# Patient Record
Sex: Male | Born: 1990 | Race: White | Hispanic: No | Marital: Married | Smoking: Never smoker
Health system: Southern US, Community
[De-identification: ages and names within clinical notes are randomized; demographics above are authoritative.]

## PROBLEM LIST (undated history)

## (undated) DIAGNOSIS — K219 Gastro-esophageal reflux disease without esophagitis: Secondary | ICD-10-CM

## (undated) HISTORY — DX: Gastro-esophageal reflux disease without esophagitis: K21.9

## (undated) HISTORY — PX: WISDOM TOOTH EXTRACTION: SHX21

---

## 2012-05-23 LAB — TSH: TSH: 1.4 (ref <=5.90)

## 2014-12-11 ENCOUNTER — Encounter: Payer: Self-pay | Admitting: Emergency Medicine

## 2014-12-11 ENCOUNTER — Emergency Department
Admission: EM | Admit: 2014-12-11 | Discharge: 2014-12-11 | Disposition: A | Discharge status: Home / Self Care | Payer: BC Managed Care – PPO | Attending: Emergency Medicine | Admitting: Emergency Medicine

## 2014-12-11 DIAGNOSIS — R21 Rash and other nonspecific skin eruption: Secondary | ICD-10-CM

## 2014-12-11 DIAGNOSIS — W57XXXA Bitten or stung by nonvenomous insect and other nonvenomous arthropods, initial encounter: Secondary | ICD-10-CM

## 2014-12-11 DIAGNOSIS — T148 Other injury of unspecified body region: Secondary | ICD-10-CM | POA: Insufficient documentation

## 2014-12-11 DIAGNOSIS — W57XXXD Bitten or stung by nonvenomous insect and other nonvenomous arthropods, subsequent encounter: Secondary | ICD-10-CM | POA: Diagnosis not present

## 2014-12-11 LAB — HEPATIC FUNCTION PANEL
ALT: 125 U/L — ABNORMAL HIGH (ref 17–63)
AST: 120 U/L — AB (ref 15–41)
Albumin: 4.7 g/dL (ref 3.5–5.0)
Alkaline Phosphatase: 60 U/L (ref 38–126)
BILIRUBIN TOTAL: 0.5 mg/dL (ref 0.3–1.2)
Total Protein: 7 g/dL (ref 6.5–8.1)

## 2014-12-11 LAB — CBC WITH DIFFERENTIAL/PLATELET
Basophils Absolute: 0.1 10*3/uL (ref 0–0.1)
Basophils Relative: 1 %
EOS PCT: 2 %
Eosinophils Absolute: 0.2 10*3/uL (ref 0–0.7)
HEMATOCRIT: 46 % (ref 40.0–52.0)
Hemoglobin: 16.2 g/dL (ref 13.0–18.0)
LYMPHS PCT: 23 %
Lymphs Abs: 2.2 10*3/uL (ref 1.0–3.6)
MCH: 29.3 pg (ref 26.0–34.0)
MCHC: 35.1 g/dL (ref 32.0–36.0)
MCV: 83.4 fL (ref 80.0–100.0)
MONO ABS: 1 10*3/uL (ref 0.2–1.0)
MONOS PCT: 10 %
NEUTROS ABS: 6.3 10*3/uL (ref 1.4–6.5)
Neutrophils Relative %: 64 %
PLATELETS: 196 10*3/uL (ref 150–440)
RBC: 5.52 MIL/uL (ref 4.40–5.90)
RDW: 12.7 % (ref 11.5–14.5)
WBC: 9.7 10*3/uL (ref 3.8–10.6)

## 2014-12-11 LAB — BASIC METABOLIC PANEL
Anion gap: 9 (ref 5–15)
BUN: 18 mg/dL (ref 6–20)
CALCIUM: 9.3 mg/dL (ref 8.9–10.3)
CO2: 23 mmol/L (ref 22–32)
CREATININE: 1.08 mg/dL (ref 0.61–1.24)
Chloride: 108 mmol/L (ref 101–111)
GFR calc Af Amer: >60 mL/min (ref >60)
GLUCOSE: 93 mg/dL (ref 65–99)
Potassium: 3.7 mmol/L (ref 3.5–5.1)
Sodium: 140 mmol/L (ref 135–145)

## 2014-12-11 MED ORDER — DOXYCYCLINE HYCLATE 100 MG PO TABS: 100.0000 mg | ORAL_TABLET | Freq: Two times a day (BID) | ORAL | Status: DC

## 2014-12-11 MED ORDER — DOXYCYCLINE HYCLATE 100 MG PO TABS
100.0000 mg | ORAL_TABLET | Freq: Once | ORAL | Status: AC
  Administered 2014-12-11: 100 mg via ORAL
  Filled 2014-12-11: qty 1

## 2014-12-11 NOTE — ED Notes (Signed)
Awaiting LFT results before pt is dc'd.  Pt aware.

## 2014-12-11 NOTE — ED Notes (Signed)
Was treated for possible strep skin infection R ankle aug 12th, finished antibiotic yesterday, also had several tick bites recently however none of them were engorged. Nauseated today.

## 2014-12-11 NOTE — ED Provider Notes (Signed)
CSN: [id number]     Arrival date & time 12/11/14  1839 History   First MD Initiated Contact with Patient 12/11/14 1936     Chief Complaint  Patient presents with  . Rash    fine rash all over began yesterday, denies itching     (Consider location/radiation/quality/duration/timing/severity/associated sxs/prior Treatment) HPI 24 year old male presents to the emergency department for evaluation of skin rash. Patient states he was recently treated for skin infection around that area of the tick bite along his right lateral ankle on 12/03/2014. He has finished the antibiotic's. He continues to have red macular rash over the right lateral ankle. Over the last 24 hours he is developed rash along the ankles and feet and hands and wrist and now spread more proximally up the arms and thighs. He has some rash on his abdomen and back. Overall patient feels well. He denies any pain or body aches. He does have mild nausea. He does present with a low-grade fever 99.3. Patient does note several tick bites on his left foot that occurred over the last few weeks.   History reviewed. No pertinent past medical history. History reviewed. No pertinent past surgical history. No family history on file. Social History  Substance Use Topics  . Smoking status: Never Smoker   . Smokeless tobacco: None  . Alcohol Use: Yes    Review of Systems  Constitutional: Negative.  Negative for fever, chills, activity change and appetite change.  HENT: Negative for congestion, ear pain, mouth sores, rhinorrhea, sinus pressure, sore throat and trouble swallowing.   Eyes: Negative for photophobia, pain and discharge.  Respiratory: Negative for cough, chest tightness and shortness of breath.   Cardiovascular: Negative for chest pain and leg swelling.  Gastrointestinal: Positive for nausea. Negative for vomiting, abdominal pain, diarrhea and abdominal distention.  Genitourinary: Negative for dysuria and difficulty urinating.   Musculoskeletal: Negative for back pain, arthralgias and gait problem.  Skin: Positive for rash. Negative for color change.  Neurological: Negative for dizziness and headaches.  Hematological: Negative for adenopathy.  Psychiatric/Behavioral: Negative for behavioral problems and agitation.      Allergies  Review of patient's allergies indicates no known allergies.  Home Medications   Prior to Admission medications   Medication Sig Start Date End Date Taking? Authorizing Provider  doxycycline (VIBRA-TABS) 100 MG tablet Take 1 tablet (100 mg total) by mouth 2 (two) times daily. 12/11/14   Duanne Guess, PA-C   BP 134/64 mmHg  Pulse 82  Temp(Src) 99.3 F (37.4 C) (Oral)  Resp 18  Ht 6\' 1"  (1.854 m)  Wt 202 lb (91.627 kg)  BMI 26.66 kg/m2  SpO2 96% Physical Exam  Constitutional: He is oriented to person, place, and time. He appears well-developed and well-nourished.  HENT:  Head: Normocephalic and atraumatic.  Eyes: Conjunctivae and EOM are normal. Pupils are equal, round, and reactive to light.  Neck: Normal range of motion. Neck supple.  Cardiovascular: Normal rate, regular rhythm, normal heart sounds and intact distal pulses.   Pulmonary/Chest: Effort normal and breath sounds normal. No respiratory distress. He has no wheezes. He has no rales. He exhibits no tenderness.  Abdominal: Soft. Bowel sounds are normal. He exhibits no distension. There is no tenderness.  Musculoskeletal: Normal range of motion. He exhibits no edema or tenderness.  Neurological: He is alert and oriented to person, place, and time.  Skin: Skin is warm and dry.  Patient with diffuse erythematous macular rash that is most prominent along the ankles  and wrist and seems to taper proximally. Rash is present on the lower legs as well as the arms. There is rash present on the back and abdomen. Erythematous macular rash will blanch. There is small areas of petechiae mixed in with erythematous macular rash.   Psychiatric: He has a normal mood and affect. His behavior is normal. Judgment and thought content normal.    ED Course  Procedures (including critical care time) Labs Review Labs Reviewed  CBC WITH DIFFERENTIAL/PLATELET  BASIC METABOLIC PANEL  HEPATIC FUNCTION PANEL    Imaging Review No results found. I have personally reviewed and evaluated these images and lab results as part of my medical decision-making.   EKG Interpretation None      MDM   Final diagnoses:  Tick bite  Skin rash    24 year old male with history of tick bites and rash for the last 24 hours. Rash began along the wrist and ankles and has been spreading centrally. He has a low-grade fever. Overall patient appears well. Labs are normal patient tolerating by mouth well. Patient was started with doxycycline 100 mg twice a day for 14 days. He was educated on red flags to return to ER for.    Duanne Guess, PA-C 12/11/14 2153  Hinda Kehr, MD 12/12/14 0005

## 2014-12-11 NOTE — Discharge Instructions (Signed)
Rash A rash is a change in the color or feel of your skin. There are many different types of rashes. You may have other problems along with your rash. HOME CARE  Avoid the thing that caused your rash.  Do not scratch your rash.  You may take cools baths to help stop itching.  Only take medicines as told by your doctor.  Keep all doctor visits as told. GET HELP RIGHT AWAY IF:   Your pain, puffiness (swelling), or redness gets worse.  You have a fever.  You have new or severe problems.  You have body aches, watery poop (diarrhea), or you throw up (vomit).  Your rash is not better after 3 days. MAKE SURE YOU:  1. Understand these instructions. 2. Will watch your condition. 3. Will get help right away if you are not doing well or get worse. Document Released: 09/26/2007 Document Revised: 07/02/2011 Document Reviewed: 01/22/2011 [facility] Patient Information 2015 [place], [place]. This information is not intended to replace advice given to you by your health care provider. Make sure you discuss any questions you have with your health care provider.  Tick Bite Information Ticks are insects that attach themselves to the skin and draw blood for food. There are various types of ticks. Common types include wood ticks and deer ticks. Most ticks live in shrubs and grassy areas. Ticks can climb onto your body when you make contact with leaves or grass where the tick is waiting. The most common places on the body for ticks to attach themselves are the scalp, neck, armpits, waist, and groin. Most tick bites are harmless, but sometimes ticks carry germs that cause diseases. These germs can be spread to a person during the tick's feeding process. The chance of a disease spreading through a tick bite depends on:   The type of tick.  Time of year.   How long the tick is attached.   Geographic location.  HOW CAN YOU PREVENT TICK BITES? Take these steps to help prevent tick bites when you are  outdoors:  Wear protective clothing. Long sleeves and long pants are best.   Wear white clothes so you can see ticks more easily.  Tuck your pant legs into your socks.   If walking on a trail, stay in the middle of the trail to avoid brushing against bushes.  Avoid walking through areas with long grass.  Put insect repellent on all exposed skin and along boot tops, pant legs, and sleeve cuffs.   Check clothing, hair, and skin repeatedly and before going inside.   Brush off any ticks that are not attached.  Take a shower or bath as soon as possible after being outdoors.  WHAT IS THE PROPER WAY TO REMOVE A TICK? Ticks should be removed as soon as possible to help prevent diseases caused by tick bites. 4. If latex gloves are available, put them on before trying to remove a tick.  5. Using fine-point tweezers, grasp the tick as close to the skin as possible. You may also use curved forceps or a tick removal tool. Grasp the tick as close to its head as possible. Avoid grasping the tick on its body. 6. Pull gently with steady upward pressure until the tick lets go. Do not twist the tick or jerk it suddenly. This may break off the tick's head or mouth parts. 7. Do not squeeze or crush the tick's body. This could force disease-carrying fluids from the tick into your body.  8. After  the tick is removed, wash the bite area and your hands with soap and water or other disinfectant such as alcohol. 9. Apply a small amount of antiseptic cream or ointment to the bite site.  10. Wash and disinfect any instruments that were used.  Do not try to remove a tick by applying a hot match, petroleum jelly, or fingernail polish to the tick. These methods do not work and may increase the chances of disease being spread from the tick bite.  WHEN SHOULD YOU SEEK MEDICAL CARE? Contact your health care provider if you are unable to remove a tick from your skin or if a part of the tick breaks off and is  stuck in the skin.  After a tick bite, you need to be aware of signs and symptoms that could be related to diseases spread by ticks. Contact your health care provider if you develop any of the following in the days or weeks after the tick bite:  Unexplained fever.  Rash. A circular rash that appears days or weeks after the tick bite may indicate the possibility of Lyme disease. The rash may resemble a target with a bull's-eye and may occur at a different part of your body than the tick bite.  Redness and swelling in the area of the tick bite.   Tender, swollen lymph glands.   Diarrhea.   Weight loss.   Cough.   Fatigue.   Muscle, joint, or bone pain.   Abdominal pain.   Headache.   Lethargy or a change in your level of consciousness.  Difficulty walking or moving your legs.   Numbness in the legs.   Paralysis.  Shortness of breath.   Confusion.   Repeated vomiting.  Document Released: 04/06/2000 Document Revised: 01/28/2013 Document Reviewed: 09/17/2012 [facility] Patient Information 2015 [place], [place]. This information is not intended to replace advice given to you by your health care provider. Make sure you discuss any questions you have with your health care provider.

## 2014-12-11 NOTE — ED Notes (Signed)
States sore on leg had been due to tick bite.

## 2016-06-04 DIAGNOSIS — Z23 Encounter for immunization: Secondary | ICD-10-CM | POA: Diagnosis not present

## 2016-08-13 DIAGNOSIS — N398 Other specified disorders of urinary system: Secondary | ICD-10-CM | POA: Diagnosis not present

## 2016-09-03 DIAGNOSIS — R300 Dysuria: Secondary | ICD-10-CM | POA: Diagnosis not present

## 2016-09-03 LAB — BASIC METABOLIC PANEL
BUN: 17 (ref 4–21)
Creatinine: 1 (ref 0.6–1.3)
Glucose: 87
POTASSIUM: 4.2 (ref 3.4–5.3)
Sodium: 141 (ref 137–147)

## 2016-09-03 LAB — CBC AND DIFFERENTIAL
HCT: 49 (ref 41–53)
HEMOGLOBIN: 16.4 (ref 13.5–17.5)
Platelets: 196 (ref 150–399)
WBC: 5.6

## 2016-09-11 DIAGNOSIS — R350 Frequency of micturition: Secondary | ICD-10-CM | POA: Diagnosis not present

## 2016-09-11 DIAGNOSIS — R102 Pelvic and perineal pain: Secondary | ICD-10-CM | POA: Diagnosis not present

## 2016-09-19 DIAGNOSIS — R102 Pelvic and perineal pain: Secondary | ICD-10-CM | POA: Diagnosis not present

## 2016-09-19 DIAGNOSIS — N3941 Urge incontinence: Secondary | ICD-10-CM | POA: Diagnosis not present

## 2016-11-22 ENCOUNTER — Encounter: Payer: Self-pay | Admitting: Family Medicine

## 2016-12-10 DIAGNOSIS — Z113 Encounter for screening for infections with a predominantly sexual mode of transmission: Secondary | ICD-10-CM | POA: Diagnosis not present

## 2016-12-10 DIAGNOSIS — Z23 Encounter for immunization: Secondary | ICD-10-CM | POA: Diagnosis not present

## 2016-12-11 LAB — HIV ANTIBODY (ROUTINE TESTING W REFLEX): HIV: NONREACTIVE

## 2016-12-26 DIAGNOSIS — F4320 Adjustment disorder, unspecified: Secondary | ICD-10-CM | POA: Diagnosis not present

## 2016-12-26 DIAGNOSIS — F515 Nightmare disorder: Secondary | ICD-10-CM | POA: Diagnosis not present

## 2017-01-01 DIAGNOSIS — F4320 Adjustment disorder, unspecified: Secondary | ICD-10-CM | POA: Diagnosis not present

## 2017-01-01 DIAGNOSIS — F515 Nightmare disorder: Secondary | ICD-10-CM | POA: Diagnosis not present

## 2017-01-08 DIAGNOSIS — F4320 Adjustment disorder, unspecified: Secondary | ICD-10-CM | POA: Diagnosis not present

## 2017-01-08 DIAGNOSIS — F515 Nightmare disorder: Secondary | ICD-10-CM | POA: Diagnosis not present

## 2017-01-16 DIAGNOSIS — R102 Pelvic and perineal pain: Secondary | ICD-10-CM | POA: Diagnosis not present

## 2017-01-16 DIAGNOSIS — F4320 Adjustment disorder, unspecified: Secondary | ICD-10-CM | POA: Diagnosis not present

## 2017-01-16 DIAGNOSIS — R350 Frequency of micturition: Secondary | ICD-10-CM | POA: Diagnosis not present

## 2017-01-23 DIAGNOSIS — F4320 Adjustment disorder, unspecified: Secondary | ICD-10-CM | POA: Diagnosis not present

## 2017-02-13 DIAGNOSIS — F4320 Adjustment disorder, unspecified: Secondary | ICD-10-CM | POA: Diagnosis not present

## 2017-02-20 DIAGNOSIS — Z23 Encounter for immunization: Secondary | ICD-10-CM | POA: Diagnosis not present

## 2017-06-11 DIAGNOSIS — Z23 Encounter for immunization: Secondary | ICD-10-CM | POA: Diagnosis not present

## 2017-06-13 DIAGNOSIS — M62830 Muscle spasm of back: Secondary | ICD-10-CM | POA: Diagnosis not present

## 2017-10-28 DIAGNOSIS — Z23 Encounter for immunization: Secondary | ICD-10-CM | POA: Diagnosis not present

## 2017-12-25 ENCOUNTER — Encounter: Payer: Self-pay | Admitting: Family Medicine

## 2017-12-25 ENCOUNTER — Ambulatory Visit (INDEPENDENT_AMBULATORY_CARE_PROVIDER_SITE_OTHER): Payer: BLUE CROSS/BLUE SHIELD | Admitting: Family Medicine

## 2017-12-25 ENCOUNTER — Ambulatory Visit: Payer: Self-pay | Admitting: Family Medicine

## 2017-12-25 VITALS — BP 110/72 | HR 63 | Temp 98.7°F | Ht 73.0 in | Wt 191.6 lb

## 2017-12-25 DIAGNOSIS — Z862 Personal history of diseases of the blood and blood-forming organs and certain disorders involving the immune mechanism: Secondary | ICD-10-CM | POA: Diagnosis not present

## 2017-12-25 DIAGNOSIS — Z83438 Family history of other disorder of lipoprotein metabolism and other lipidemia: Secondary | ICD-10-CM | POA: Diagnosis not present

## 2017-12-25 DIAGNOSIS — Z23 Encounter for immunization: Secondary | ICD-10-CM

## 2017-12-25 DIAGNOSIS — Z0000 Encounter for general adult medical examination without abnormal findings: Secondary | ICD-10-CM | POA: Diagnosis not present

## 2017-12-25 NOTE — Patient Instructions (Signed)
Preventive Care 18-39 Years, Male Preventive care refers to lifestyle choices and visits with your health care provider that can promote health and wellness. What does preventive care include?  A yearly physical exam. This is also called an annual well check.  Dental exams once or twice a year.  Routine eye exams. Ask your health care provider how often you should have your eyes checked.  Personal lifestyle choices, including: ? Daily care of your teeth and gums. ? Regular physical activity. ? Eating a healthy diet. ? Avoiding tobacco and drug use. ? Limiting alcohol use. ? Practicing safe sex. What happens during an annual well check? The services and screenings done by your health care provider during your annual well check will depend on your age, overall health, lifestyle risk factors, and family history of disease. Counseling Your health care provider may ask you questions about your:  Alcohol use.  Tobacco use.  Drug use.  Emotional well-being.  Home and relationship well-being.  Sexual activity.  Eating habits.  Work and work Statistician.  Screening You may have the following tests or measurements:  Height, weight, and BMI.  Blood pressure.  Lipid and cholesterol levels. These may be checked every 5 years starting at age 34.  Diabetes screening. This is done by checking your blood sugar (glucose) after you have not eaten for a while (fasting).  Skin check.  Hepatitis C blood test.  Hepatitis B blood test.  Sexually transmitted disease (STD) testing.  Discuss your test results, treatment options, and if necessary, the need for more tests with your health care provider. Vaccines Your health care provider may recommend certain vaccines, such as:  Influenza vaccine. This is recommended every year.  Tetanus, diphtheria, and acellular pertussis (Tdap, Td) vaccine. You may need a Td booster every 10 years.  Varicella vaccine. You may need this if you  have not been vaccinated.  HPV vaccine. If you are 23 or younger, you may need three doses over 6 months.  Measles, mumps, and rubella (MMR) vaccine. You may need at least one dose of MMR.You may also need a second dose.  Pneumococcal 13-valent conjugate (PCV13) vaccine. You may need this if you have certain conditions and have not been vaccinated.  Pneumococcal polysaccharide (PPSV23) vaccine. You may need one or two doses if you smoke cigarettes or if you have certain conditions.  Meningococcal vaccine. One dose is recommended if you are age 65-21 years and a first-year college student living in a residence hall, or if you have one of several medical conditions. You may also need additional booster doses.  Hepatitis A vaccine. You may need this if you have certain conditions or if you travel or work in places where you may be exposed to hepatitis A.  Hepatitis B vaccine. You may need this if you have certain conditions or if you travel or work in places where you may be exposed to hepatitis B.  Haemophilus influenzae type b (Hib) vaccine. You may need this if you have certain risk factors.  Talk to your health care provider about which screenings and vaccines you need and how often you need them. This information is not intended to replace advice given to you by your health care provider. Make sure you discuss any questions you have with your health care provider. Document Released: 06/05/2001 Document Revised: 12/28/2015 Document Reviewed: 02/08/2015 Elsevier Interactive Patient Education  [employer].

## 2017-12-25 NOTE — Progress Notes (Addendum)
Disregard. See other progress note

## 2017-12-25 NOTE — Progress Notes (Deleted)
Patient: Brandon Mcbride, Male    DOB: 06/01/1990, 27 y.o.   MRN: [id number] Visit Date: 12/25/2017  Today's Provider: [name], MD   No chief complaint on file.  Subjective:   New Patient: Brandon Mcbride is a 27 year old male who presents today to [facility] as a new patient.  -----------------------------------------------------------------   Review of Systems  Constitutional: Negative.   HENT: Negative.   Eyes: Negative.   Respiratory: Negative.   Cardiovascular: Negative.   Gastrointestinal: Negative.   Endocrine: Negative.   Genitourinary: Negative.   Musculoskeletal: Negative.   Skin: Negative.   Allergic/Immunologic: Negative.   Neurological: Negative.   Hematological: Negative.   Psychiatric/Behavioral: Negative.     Social History      He         Social History   Socioeconomic History  . Marital status: Not on file    Spouse name: Not on file  . Number of children: Not on file  . Years of education: Not on file  . Highest education level: Not on file  Occupational History  . Not on file  Social Needs  . Financial resource strain: Not on file  . Food insecurity:    Worry: Not on file    Inability: Not on file  . Transportation needs:    Medical: Not on file    Non-medical: Not on file  Tobacco Use  . Smoking status: Not on file  Substance and Sexual Activity  . Alcohol use: Not on file  . Drug use: Not on file  . Sexual activity: Not on file  Lifestyle  . Physical activity:    Days per week: Not on file    Minutes per session: Not on file  . Stress: Not on file  Relationships  . Social connections:    Talks on phone: Not on file    Gets together: Not on file    Attends religious service: Not on file    Active member of club or organization: Not on file    Attends meetings of clubs or organizations: Not on file    Relationship status: Not on file  Other Topics Concern  . Not on file  Social History Narrative    . Not on file    No past medical history on file.   There are no active problems to display for this patient.   *** The histories are not reviewed yet. Please review them in the "History" navigator section and refresh this Guy.  Family History        No family status information on file.        His family history is not on file.      Allergies not on file  No current outpatient medications on file.   No care team member to display      Objective:   Vitals: There were no vitals taken for this visit.  There were no vitals filed for this visit.   Physical Exam   Depression Screen No flowsheet data found.    Assessment & Plan:     Routine Health Maintenance and Physical Exam  Exercise Activities and Dietary recommendations Goals   None      There is no immunization history on file for this patient.  There are no preventive care reminders to display for this patient.   Discussed health benefits of physical activity, and encouraged him to engage in regular exercise appropriate for his age and  condition.    --------------------------------------------------------------------    [name], MD  [facility]

## 2017-12-26 NOTE — Assessment & Plan Note (Signed)
Will request records from [place]

## 2017-12-26 NOTE — Progress Notes (Signed)
Patient: Brandon Mcbride, Male    DOB: 07-18-1990, 27 y.o.   MRN: [id number] Visit Date: 12/26/2017  Today's Provider: [name], MD   Chief Complaint  Patient presents with  . Establish Care   Subjective:  I, [name], CMA, am acting as a scribe for [name], MD.   New Patient:  Brandon Mcbride is a 27 year old male who presents today to [place] as a new patient. He states he was being seen at [facility]. Patient denies any issues or concerns today.    Last year was having recurrent sinusitis.  He was seen by an ENT at [facility] who cultured the area and found significant eosinophil concentrations.  He denies seasonal allergies.  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  He took Alavert consistently for months and eosinophil count decreased by half.  He has not been taking this recently. -----------------------------------------------------------------   Review of Systems  Constitutional: Negative.   HENT: Positive for congestion and sinus pressure. Negative for dental problem, drooling, ear discharge, ear pain, facial swelling, hearing loss, mouth sores, nosebleeds, postnasal drip, rhinorrhea, sinus pain, sneezing, sore throat, tinnitus, trouble swallowing and voice change.   Eyes: Negative.   Respiratory: Negative.   Cardiovascular: Negative.   Gastrointestinal: Negative.   Endocrine: Negative.   Genitourinary: Negative.   Musculoskeletal: Negative.   Skin: Negative.   Allergic/Immunologic: Negative.   Neurological: Negative.   Hematological: Negative.   Psychiatric/Behavioral: Negative.     Social History      He  reports that he has never smoked. He has never used smokeless tobacco. He reports that he drinks about 7.0 - 12.0 standard drinks of alcohol per week. He reports that he does not use drugs.       Social History   Socioeconomic History  . Marital status: Married    Spouse name: Not  on file  . Number of children: 0  . Years of education: Not on file  . Highest education level: Not on file  Occupational History  . Occupation: PhD Ship broker in [place]  . Financial resource strain: Not on file  . Food insecurity:    Worry: Not on file    Inability: Not on file  . Transportation needs:    Medical: Not on file    Non-medical: Not on file  Tobacco Use  . Smoking status: Never Smoker  . Smokeless tobacco: Never Used  Substance and Sexual Activity  . Alcohol use: Yes    Alcohol/week: 7.0 - 12.0 standard drinks    Types: 3 - 5 Glasses of wine, 3 - 5 Cans of beer, 1 - 2 Shots of liquor per week  . Drug use: Never  . Sexual activity: Yes    Partners: Male    Comment: with husband  Lifestyle  . Physical activity:    Days per week: Not on file    Minutes per session: Not on file  . Stress: Not on file  Relationships  . Social connections:    Talks on phone: Not on file    Gets together: Not on file    Attends religious service: Not on file    Active member of club or organization: Not on file    Attends meetings of clubs or organizations: Not on file    Relationship status: Not on file  Other Topics Concern  . Not on file  Social History Narrative  .  Not on file    History reviewed. No pertinent past medical history.   Patient Active Problem List   Diagnosis Date Noted  . 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 WISDOM TOOTH EXTRACTION      Family History        Family Status  Relation Name Status  . Mother  Alive  . Father  Alive  . Annamarie Major  (Not Specified)  . MGM  Alive  . PGM  Deceased  . PGF  Deceased  . Brother  Alive  . MGF  Alive        His family history includes ADD / ADHD in his brother; Alzheimer's disease in his paternal grandmother; Breast cancer (age of onset: 86) in his maternal grandmother; Esophageal cancer in his maternal grandmother; Healthy in his mother;  Hyperlipidemia in his father; Hypertension in his father; Liver cancer in his paternal uncle; Parkinson's disease in his paternal grandfather.      No Known Allergies  No current outpatient medications on file.   Patient Care Team: Virginia Crews, MD as PCP - General (Family Medicine) Brita Romp, Dionne Bucy, MD (Family Medicine)      Objective:   Vitals: BP 110/72 (BP Location: Right Arm, Patient Position: Sitting, Cuff Size: Normal)   Pulse 63   Temp 98.7 F (37.1 C) (Oral)   Ht 6\' 1"  (1.854 m)   Wt 191 lb 9.6 oz (86.9 kg)   SpO2 99%   BMI 25.28 kg/m    Vitals:   12/25/17 1431  BP: 110/72  Pulse: 63  Temp: 98.7 F (37.1 C)  TempSrc: Oral  SpO2: 99%  Weight: 191 lb 9.6 oz (86.9 kg)  Height: 6\' 1"  (1.854 m)     Physical Exam  Constitutional: He is oriented to person, place, and time. He appears well-developed and well-nourished. No distress.  HENT:  Head: Normocephalic and atraumatic.  Right Ear: External ear normal.  Left Ear: External ear normal.  Nose: Nose normal.  Mouth/Throat: Oropharynx is clear and moist.  Eyes: Pupils are equal, round, and reactive to light. Conjunctivae and EOM are normal. No scleral icterus.  Neck: Neck supple. No thyromegaly present.  Cardiovascular: Normal rate, regular rhythm, normal heart sounds and intact distal pulses.  No murmur heard. Pulmonary/Chest: Effort normal and breath sounds normal. No respiratory distress. He has no wheezes. He has no rales.  Abdominal: Soft. Bowel sounds are normal. He exhibits no distension. There is no tenderness. There is no rebound and no guarding.  Musculoskeletal: He exhibits no edema or deformity.  Lymphadenopathy:    He has no cervical adenopathy.  Neurological: He is alert and oriented to person, place, and time.  Skin: Skin is warm and dry. Capillary refill takes less than 2 seconds. No rash noted.  Psychiatric: He has a normal mood and affect. His behavior is normal.  Vitals  reviewed.    Depression Screen PHQ 2/9 Scores 12/25/2017  PHQ - 2 Score 0  PHQ- 9 Score 0     Assessment & Plan:     Routine Health Maintenance and Physical Exam  Exercise Activities and Dietary recommendations Goals   None     Immunization History  Administered Date(s) Administered  . Influenza,inj,Quad PF,6+ Mos 12/25/2017    Health Maintenance  Topic Date Due  . HIV Screening  08/19/2006  . TETANUS/TDAP  08/19/2010  . INFLUENZA VACCINE  Completed     Discussed health benefits  of physical activity, and encouraged him to engage in regular exercise appropriate for his age and condition.    --------------------------------------------------------------------  Problem List Items Addressed This Visit      Other   History of eosinophilia    Will request records from [facility] CBC      Relevant Orders   CBC with Differential    Other Visit Diagnoses    Encounter for annual physical exam    -  Primary   Relevant Orders   TSH   Comprehensive Metabolic Panel (CMET)   Need for influenza vaccination       Relevant Orders   Flu Vaccine QUAD 6+ mos PF IM (Fluarix Quad PF) (Completed)   Family history of hyperlipidemia       Relevant Orders   Lipid Profile   Comprehensive Metabolic Panel (CMET)       Return in about 1 year (around 12/26/2018) for CPE.   The entirety of the information documented in the History of Present Illness, Review of Systems and Physical Exam were personally obtained by me. Portions of this information were initially documented by [name], CMA and reviewed by me for thoroughness and accuracy.    Virginia Crews, MD, MPH [facility] 12/26/2017 10:33 AM

## 2017-12-31 DIAGNOSIS — Z83438 Family history of other disorder of lipoprotein metabolism and other lipidemia: Secondary | ICD-10-CM | POA: Diagnosis not present

## 2017-12-31 DIAGNOSIS — Z0000 Encounter for general adult medical examination without abnormal findings: Secondary | ICD-10-CM | POA: Diagnosis not present

## 2017-12-31 DIAGNOSIS — Z862 Personal history of diseases of the blood and blood-forming organs and certain disorders involving the immune mechanism: Secondary | ICD-10-CM | POA: Diagnosis not present

## 2018-01-01 LAB — CBC WITH DIFFERENTIAL/PLATELET
BASOS: 0 %
Basophils Absolute: 0 10*3/uL (ref 0.0–0.2)
EOS (ABSOLUTE): 0.2 10*3/uL (ref 0.0–0.4)
EOS: 5 %
HEMOGLOBIN: 14.9 g/dL (ref 13.0–17.7)
Hematocrit: 44 % (ref 37.5–51.0)
IMMATURE GRANS (ABS): 0 10*3/uL (ref 0.0–0.1)
Immature Granulocytes: 0 %
LYMPHS: 34 %
Lymphocytes Absolute: 1.6 10*3/uL (ref 0.7–3.1)
MCH: 28.9 pg (ref 26.6–33.0)
MCHC: 33.9 g/dL (ref 31.5–35.7)
MCV: 85 fL (ref 79–97)
MONOCYTES: 10 %
Monocytes Absolute: 0.5 10*3/uL (ref 0.1–0.9)
NEUTROS ABS: 2.4 10*3/uL (ref 1.4–7.0)
Neutrophils: 51 %
Platelets: 201 10*3/uL (ref 150–450)
RBC: 5.16 x10E6/uL (ref 4.14–5.80)
RDW: 13.1 % (ref 12.3–15.4)
WBC: 4.8 10*3/uL (ref 3.4–10.8)

## 2018-01-01 LAB — COMPREHENSIVE METABOLIC PANEL
A/G RATIO: 2.5 — AB (ref 1.2–2.2)
ALT: 18 IU/L (ref 0–44)
AST: 14 IU/L (ref 0–40)
Albumin: 4.7 g/dL (ref 3.5–5.5)
Alkaline Phosphatase: 69 IU/L (ref 39–117)
BUN/Creatinine Ratio: 11 (ref 9–20)
BUN: 12 mg/dL (ref 6–20)
Bilirubin Total: 1 mg/dL (ref 0.0–1.2)
CALCIUM: 9.1 mg/dL (ref 8.7–10.2)
CO2: 23 mmol/L (ref 20–29)
CREATININE: 1.06 mg/dL (ref 0.76–1.27)
Chloride: 103 mmol/L (ref 96–106)
GFR, EST AFRICAN AMERICAN: 111 mL/min/{1.73_m2} (ref >59)
GFR, EST NON AFRICAN AMERICAN: 96 mL/min/{1.73_m2} (ref >59)
GLOBULIN, TOTAL: 1.9 g/dL (ref 1.5–4.5)
Glucose: 85 mg/dL (ref 65–99)
POTASSIUM: 4 mmol/L (ref 3.5–5.2)
Sodium: 139 mmol/L (ref 134–144)
TOTAL PROTEIN: 6.6 g/dL (ref 6.0–8.5)

## 2018-01-01 LAB — LIPID PANEL
CHOLESTEROL TOTAL: 136 mg/dL (ref 100–199)
Chol/HDL Ratio: 3.1 ratio (ref 0.0–5.0)
HDL: 44 mg/dL (ref >39)
LDL Calculated: 77 mg/dL (ref 0–99)
TRIGLYCERIDES: 74 mg/dL (ref 0–149)
VLDL Cholesterol Cal: 15 mg/dL (ref 5–40)

## 2018-01-01 LAB — TSH: TSH: 1.92 u[IU]/mL (ref 0.450–4.500)

## 2018-01-06 ENCOUNTER — Encounter: Payer: Self-pay | Admitting: Family Medicine

## 2018-01-06 LAB — GONORRHEA SCREEN
Chlamydia by NAA: NEGATIVE
Gonococcus by NAA: NEGATIVE

## 2018-12-11 ENCOUNTER — Other Ambulatory Visit: Payer: Self-pay

## 2018-12-11 DIAGNOSIS — Z20822 Contact with and (suspected) exposure to covid-19: Secondary | ICD-10-CM

## 2018-12-12 LAB — NOVEL CORONAVIRUS, NAA: SARS-CoV-2, NAA: NOT DETECTED

## 2018-12-30 ENCOUNTER — Encounter: Payer: BLUE CROSS/BLUE SHIELD | Admitting: Family Medicine

## 2019-02-23 ENCOUNTER — Encounter: Payer: Self-pay | Admitting: Family Medicine

## 2019-02-23 ENCOUNTER — Other Ambulatory Visit: Payer: Self-pay

## 2019-02-23 ENCOUNTER — Ambulatory Visit (INDEPENDENT_AMBULATORY_CARE_PROVIDER_SITE_OTHER): Payer: BC Managed Care – PPO | Admitting: Family Medicine

## 2019-02-23 VITALS — BP 131/75 | HR 61 | Temp 97.5°F | Ht 73.0 in | Wt 171.8 lb

## 2019-02-23 DIAGNOSIS — Z0000 Encounter for general adult medical examination without abnormal findings: Secondary | ICD-10-CM | POA: Diagnosis not present

## 2019-02-23 DIAGNOSIS — Z23 Encounter for immunization: Secondary | ICD-10-CM | POA: Diagnosis not present

## 2019-02-23 NOTE — Patient Instructions (Signed)
Preventive Care 106-27 Years Old, Male Preventive care refers to lifestyle choices and visits with your health care provider that can promote health and wellness. This includes:  A yearly physical exam. This is also called an annual well check.  Regular dental and eye exams.  Immunizations.  Screening for certain conditions.  Healthy lifestyle choices, such as eating a healthy diet, getting regular exercise, not using drugs or products that contain nicotine and tobacco, and limiting alcohol use. What can I expect for my preventive care visit? Physical exam Your health care provider will check:  Height and weight. These may be used to calculate body mass index (BMI), which is a measurement that tells if you are at a healthy weight.  Heart rate and blood pressure.  Your skin for abnormal spots. Counseling Your health care provider may ask you questions about:  Alcohol, tobacco, and drug use.  Emotional well-being.  Home and relationship well-being.  Sexual activity.  Eating habits.  Work and work Statistician. What immunizations do I need?  Influenza (flu) vaccine  This is recommended every year. Tetanus, diphtheria, and pertussis (Tdap) vaccine  You may need a Td booster every 10 years. Varicella (chickenpox) vaccine  You may need this vaccine if you have not already been vaccinated. Human papillomavirus (HPV) vaccine  If recommended by your health care provider, you may need three doses over 6 months. Measles, mumps, and rubella (MMR) vaccine  You may need at least one dose of MMR. You may also need a second dose. Meningococcal conjugate (MenACWY) vaccine  One dose is recommended if you are 11-70 years old and a Market researcher living in a residence hall, or if you have one of several medical conditions. You may also need additional booster doses. Pneumococcal conjugate (PCV13) vaccine  You may need this if you have certain conditions and were not  previously vaccinated. Pneumococcal polysaccharide (PPSV23) vaccine  You may need one or two doses if you smoke cigarettes or if you have certain conditions. Hepatitis A vaccine  You may need this if you have certain conditions or if you travel or work in places where you may be exposed to hepatitis A. Hepatitis B vaccine  You may need this if you have certain conditions or if you travel or work in places where you may be exposed to hepatitis B. Haemophilus influenzae type b (Hib) vaccine  You may need this if you have certain risk factors. You may receive vaccines as individual doses or as more than one vaccine together in one shot (combination vaccines). Talk with your health care provider about the risks and benefits of combination vaccines. What tests do I need? Blood tests  Lipid and cholesterol levels. These may be checked every 5 years starting at age 46.  Hepatitis C test.  Hepatitis B test. Screening   Diabetes screening. This is done by checking your blood sugar (glucose) after you have not eaten for a while (fasting).  Sexually transmitted disease (STD) testing. Talk with your health care provider about your test results, treatment options, and if necessary, the need for more tests. Follow these instructions at home: Eating and drinking   Eat a diet that includes fresh fruits and vegetables, whole grains, lean protein, and low-fat dairy products.  Take vitamin and mineral supplements as recommended by your health care provider.  Do not drink alcohol if your health care provider tells you not to drink.  If you drink alcohol: ? Limit how much you have to 0-2  drinks a day. ? Be aware of how much alcohol is in your drink. In the [place] one drink equals one 12 oz bottle of beer (355 mL), one 5 oz glass of wine (148 mL), or one 1 oz glass of hard liquor (44 mL). Lifestyle  Take daily care of your teeth and gums.  Stay active. Exercise for at least 30 minutes on 5 or  more days each week.  Do not use any products that contain nicotine or tobacco, such as cigarettes, e-cigarettes, and chewing tobacco. If you need help quitting, ask your health care provider.  If you are sexually active, practice safe sex. Use a condom or other form of protection to prevent STIs (sexually transmitted infections). What's next?  Go to your health care provider once a year for a well check visit.  Ask your health care provider how often you should have your eyes and teeth checked.  Stay up to date on all vaccines. This information is not intended to replace advice given to you by your health care provider. Make sure you discuss any questions you have with your health care provider. Document Released: 06/05/2001 Document Revised: 04/03/2018 Document Reviewed: 04/03/2018 Elsevier Patient Education  2020 [employer].

## 2019-02-23 NOTE — Progress Notes (Signed)
Patient: Brandon Mcbride, Male    DOB: January 04, 1991, 28 y.o.   MRN: [id number] Visit Date: 02/23/2019  Today's Provider: [name], MD   Chief Complaint  Patient presents with  . Annual Exam   Subjective:    Annual physical exam Brandon Mcbride is a 28 y.o. male who presents today for health maintenance and complete physical. He feels fairly well. He reports exercising includes walking. He reports he is sleeping fairly well.  He had some slight nausea before eating and gas cramping over the weekend.  IT is improving.  He hasnt taken anything for it. No vomiting, diarrhea, constipation, fever, abd pain.  Happened a few years ago and self-resolved.  Has been more stressed than usual lately.  Also having intermittent burning pain in 4th and 5th MCP joints bilaterally that will self resolve. Over last several months.  No swelling, redness, stiffness, fever.  Has been typing a lot more and holding virtual courses. -----------------------------------------------------------------   Review of Systems  Constitutional: Negative.   HENT: Negative.   Eyes: Negative.   Respiratory: Negative.   Cardiovascular: Negative.   Gastrointestinal: Negative.   Endocrine: Negative.   Genitourinary: Negative.   Musculoskeletal: Positive for arthralgias.  Allergic/Immunologic: Negative.   Neurological: Negative.   Hematological: Negative.   Psychiatric/Behavioral: Positive for decreased concentration. The patient is nervous/anxious.     Social History He  reports that he has never smoked. He has never used smokeless tobacco. He reports current alcohol use of about 7.0 - 12.0 standard drinks of alcohol per week. He reports that he does not use drugs. Social History   Socioeconomic History  . Marital status: Married    Spouse name: Not on file  . Number of children: 0  . Years of education: Not on file  . Highest education level: Not on file   Occupational History  . Occupation: PhD Ship broker in [place]  . Financial resource strain: Not on file  . Food insecurity    Worry: Not on file    Inability: Not on file  . Transportation needs    Medical: Not on file    Non-medical: Not on file  Tobacco Use  . Smoking status: Never Smoker  . Smokeless tobacco: Never Used  Substance and Sexual Activity  . Alcohol use: Yes    Alcohol/week: 7.0 - 12.0 standard drinks    Types: 3 - 5 Glasses of wine, 3 - 5 Cans of beer, 1 - 2 Shots of liquor per week  . Drug use: Never  . Sexual activity: Yes    Partners: Male    Comment: with husband  Lifestyle  . Physical activity    Days per week: Not on file    Minutes per session: Not on file  . Stress: Not on file  Relationships  . Social Herbalist on phone: Not on file    Gets together: Not on file    Attends religious service: Not on file    Active member of club or organization: Not on file    Attends meetings of clubs or organizations: Not on file    Relationship status: Not on file  Other Topics Concern  . Not on file  Social History Narrative  . Not on file    Patient Active Problem List   Diagnosis Date Noted  . 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 WISDOM  TOOTH EXTRACTION      Family History  Family Status  Relation Name Status  . Mother  Alive  . Father  Alive  . Annamarie Major  (Not Specified)  . MGM  Alive  . PGM  Deceased  . PGF  Deceased  . Brother  Alive  . MGF  Alive   His family history includes ADD / ADHD in his brother; Alzheimer's disease in his paternal grandmother; Breast cancer (age of onset: 28) in his maternal grandmother; Esophageal cancer in his maternal grandmother; Healthy in his mother; Hyperlipidemia in his father; Hypertension in his father; Liver cancer in his paternal uncle; Parkinson's disease in his paternal grandfather.     No Known Allergies  Previous  Medications   No medications on file    Patient Care Team: Virginia Crews, MD as PCP - General (Family Medicine) Brita Romp, Dionne Bucy, MD (Family Medicine)      Objective:   Vitals: BP 131/75 (BP Location: Left Arm, Patient Position: Sitting, Cuff Size: Normal)   Pulse 61   Temp (!) 97.5 F (36.4 C) (Temporal)   Ht 6' 1"  (1.854 m)   Wt 171 lb 12.8 oz (77.9 kg)   BMI 22.67 kg/m    Physical Exam Vitals signs reviewed.  Constitutional:      General: He is not in acute distress.    Appearance: Normal appearance. He is well-developed. He is not diaphoretic.  HENT:     Head: Normocephalic and atraumatic.     Right Ear: Tympanic membrane, ear canal and external ear normal.     Left Ear: Tympanic membrane, ear canal and external ear normal.  Eyes:     General: No scleral icterus.    Conjunctiva/sclera: Conjunctivae normal.     Pupils: Pupils are equal, round, and reactive to light.  Neck:     Musculoskeletal: Neck supple.     Thyroid: No thyromegaly.  Cardiovascular:     Rate and Rhythm: Normal rate and regular rhythm.     Heart sounds: Normal heart sounds. No murmur.  Pulmonary:     Effort: Pulmonary effort is normal. No respiratory distress.     Breath sounds: Normal breath sounds. No wheezing or rales.  Abdominal:     General: There is no distension.     Palpations: Abdomen is soft.     Tenderness: There is no abdominal tenderness. There is no guarding or rebound.  Musculoskeletal:        General: No deformity.     Right lower leg: No edema.     Left lower leg: No edema.  Lymphadenopathy:     Cervical: No cervical adenopathy.  Skin:    General: Skin is warm and dry.     Capillary Refill: Capillary refill takes less than 2 seconds.     Findings: No rash.  Neurological:     Mental Status: He is alert and oriented to person, place, and time. Mental status is at baseline.  Psychiatric:        Mood and Affect: Mood normal.        Behavior: Behavior normal.         Thought Content: Thought content normal.      Depression Screen PHQ 2/9 Scores 02/23/2019 12/25/2017  PHQ - 2 Score 1 0  PHQ- 9 Score 3 0      Assessment & Plan:     Routine Health Maintenance and Physical Exam  Exercise Activities and Dietary recommendations Goals   None  Immunization History  Administered Date(s) Administered  . DTaP 12/17/1991, 04/08/1992, 11/09/1992, 10/15/1993, 09/07/1996  . Hepatitis A 12/10/2016, 06/11/2017  . Hepatitis B 10/16/1991, 04/08/1992, 08/03/1992  . HiB (PRP-OMP) 10/16/1991, 12/17/1991, 04/08/1992, 08/25/1992  . Hpv 12/10/2016, 06/11/2017, 10/28/2017  . IPV 10/16/1991, 12/17/1991, 08/03/1992, 09/07/1996  . Influenza,inj,Quad PF,6+ Mos 12/25/2017  . MMR 08/25/1992, 09/07/1996  . Td 08/23/2009    Health Maintenance  Topic Date Due  . INFLUENZA VACCINE  11/22/2018  . TETANUS/TDAP  08/24/2019  . HIV Screening  Completed     Discussed health benefits of physical activity, and encouraged him to engage in regular exercise appropriate for his age and condition.    Reviewed last year's labs - will plan to recheck at age 50 --------------------------------------------------------------------  Problem List Items Addressed This Visit    None    Visit Diagnoses    Encounter for annual physical exam    -  Primary   Need for influenza vaccination       Relevant Orders   Flu Vaccine QUAD 36+ mos IM (Completed)       Return in about 1 year (around 02/23/2020) for CPE.   The entirety of the information documented in the History of Present Illness, Review of Systems and Physical Exam were personally obtained by me. Portions of this information were initially documented by [facility], CMA and reviewed by me for thoroughness and accuracy.    Deshun Sedivy, Dionne Bucy, MD MPH [facility]

## 2019-06-02 ENCOUNTER — Encounter: Payer: Self-pay | Admitting: Family Medicine

## 2019-06-02 ENCOUNTER — Ambulatory Visit (INDEPENDENT_AMBULATORY_CARE_PROVIDER_SITE_OTHER): Payer: BC Managed Care – PPO | Admitting: Family Medicine

## 2019-06-02 ENCOUNTER — Other Ambulatory Visit (HOSPITAL_COMMUNITY)
Admission: RE | Admit: 2019-06-02 | Discharge: 2019-06-02 | Disposition: A | Discharge status: Home / Self Care | Payer: BC Managed Care – PPO | Source: Ambulatory Visit | Attending: Family Medicine | Admitting: Family Medicine

## 2019-06-02 ENCOUNTER — Other Ambulatory Visit: Payer: Self-pay

## 2019-06-02 VITALS — BP 112/64 | HR 64 | Temp 97.5°F | Wt 171.0 lb

## 2019-06-02 DIAGNOSIS — N451 Epididymitis: Secondary | ICD-10-CM | POA: Insufficient documentation

## 2019-06-02 LAB — POCT URINALYSIS DIPSTICK
Bilirubin, UA: NEGATIVE
Blood, UA: NEGATIVE
Glucose, UA: NEGATIVE
Ketones, UA: NEGATIVE
Leukocytes, UA: NEGATIVE
Nitrite, UA: NEGATIVE
Protein, UA: NEGATIVE
Spec Grav, UA: 1.025 (ref 1.010–1.025)
Urobilinogen, UA: 0.2 E.U./dL
pH, UA: 6 (ref 5.0–8.0)

## 2019-06-02 MED ORDER — LEVOFLOXACIN 500 MG PO TABS: 500.0000 mg | ORAL_TABLET | Freq: Every day | ORAL | 0 refills | Status: DC

## 2019-06-02 NOTE — Progress Notes (Signed)
Patient: Brandon Mcbride Male    DOB: 02/29/1991   29 y.o.   MRN: [id number] Visit Date: 06/02/2019  Today's Provider: [name], MD   Chief Complaint  Patient presents with  . Testicle Pain    Started the beginning of January    Subjective:     Testicle Pain The patient's primary symptoms include testicular pain. The patient's pertinent negatives include no penile discharge, penile pain or scrotal swelling. This is a new problem. The problem occurs intermittently. Pertinent negatives include no dysuria, flank pain, frequency, headaches or urgency. The testicular pain affects both (Worse on the right than left) testicles. Nothing aggravates the symptoms. He has tried OTC analgesics for the symptoms. The treatment provided no relief.   R>L Worse with having legs crossed or putting pressure on them Did self exam, did not feel any lump on testes themselves. R epididymis felt more swollen than the L  Sexually active with 1 male partner. Monogamous for many years. No h/o STD.  Never had this pain before.  No Known Allergies   Current Outpatient Medications:  .  esomeprazole (NEXIUM) 20 MG packet, , Disp: , Rfl:   Review of Systems  Constitutional: Negative.   Genitourinary: Positive for testicular pain. Negative for decreased urine volume, difficulty urinating, discharge, dysuria, enuresis, flank pain, frequency, genital sores, hematuria, penile pain, penile swelling, scrotal swelling and urgency.  Neurological: Negative for dizziness, light-headedness and headaches.    Social History   Tobacco Use  . Smoking status: Never Smoker  . Smokeless tobacco: Never Used  Substance Use Topics  . Alcohol use: Yes    Alcohol/week: 7.0 - 12.0 standard drinks    Types: 3 - 5 Glasses of wine, 3 - 5 Cans of beer, 1 - 2 Shots of liquor per week      Objective:   BP 112/64 (BP Location: Left Arm, Patient Position: Sitting, Cuff Size: Large)   Pulse 64    Temp (!) 97.5 F (36.4 C) (Temporal)   Wt 171 lb (77.6 kg)   SpO2 99%   BMI 22.56 kg/m  Vitals:   06/02/19 0806  BP: 112/64  Pulse: 64  Temp: (!) 97.5 F (36.4 C)  TempSrc: Temporal  SpO2: 99%  Weight: 171 lb (77.6 kg)  Body mass index is 22.56 kg/m.   Physical Exam Vitals reviewed.  Constitutional:      General: He is not in acute distress.    Appearance: Normal appearance. He is not diaphoretic.  HENT:     Head: Normocephalic and atraumatic.  Eyes:     General: No scleral icterus.    Conjunctiva/sclera: Conjunctivae normal.  Cardiovascular:     Rate and Rhythm: Normal rate and regular rhythm.     Heart sounds: Normal heart sounds. No murmur.  Pulmonary:     Effort: Pulmonary effort is normal. No respiratory distress.     Breath sounds: Normal breath sounds. No wheezing.  Genitourinary:    Pubic Area: No rash.      Penis: Circumcised.      Testes: Normal. Cremasteric reflex is present.        Right: Mass, tenderness or swelling not present.        Left: Mass, tenderness or swelling not present.     Epididymis:     Right: Enlarged. Tenderness present. No mass.     Left: Normal.  Skin:    General: Skin is warm and dry.  Findings: No rash.  Neurological:     Mental Status: He is alert and oriented to person, place, and time. Mental status is at baseline.  Psychiatric:        Mood and Affect: Mood normal.        Behavior: Behavior normal.      No results found for any visits on 06/02/19.     Assessment & Plan    1. Epididymitis, right - history and exam consistent with epididymitis -We will check UA and urine gonorrhea/chlamydia, but patient is low risk for STDs -Start treatment with Levaquin x10 days, as he is at risk for enteric organisms (MSM) -Discussed return precautions -Referral to urology per patient request, but if he is improved with antibiotic therapy, he may be able to cancel this appointment - Ambulatory referral to Urology - Urine  cytology ancillary only   Meds ordered this encounter  Medications  . levofloxacin (LEVAQUIN) 500 MG tablet    Sig: Take 1 tablet (500 mg total) by mouth daily.    Dispense:  10 tablet    Refill:  0     Return if symptoms worsen or fail to improve.   The entirety of the information documented in the History of Present Illness, Review of Systems and Physical Exam were personally obtained by me. Portions of this information were initially documented by Ashley Royalty, CMA and reviewed by me for thoroughness and accuracy.    Monasia Lair, Dionne Bucy, MD MPH [facility]

## 2019-06-02 NOTE — Patient Instructions (Signed)
Epididymitis  Epididymitis is swelling (inflammation) or infection of the epididymis. The epididymis is a cord-like structure that is located along the top and back part of the testicle. It collects and stores sperm from the testicle. This condition can also cause pain and swelling of the testicle and scrotum. Symptoms usually start suddenly (acute epididymitis). Sometimes epididymitis starts gradually and lasts for a while (chronic epididymitis). This type may be harder to treat. What are the causes? In men ages 20-40, this condition is usually caused by a bacterial infection or a sexually transmitted disease (STD), such as:  Gonorrhea.  Chlamydia. In men 40 and older who do not have anal sex, this condition is usually caused by bacteria from a blockage or from abnormalities in the urinary system. These can result from:  Having a tube placed into the bladder (urinary catheter).  Having an enlarged or inflamed prostate gland.  Having recently had urinary tract surgery.  Having a problem with a backward flow of urine (retrograde). In men who have a condition that weakens the body's defense system (immune system), such as HIV, this condition can be caused by:  Other bacteria, including tuberculosis and syphilis.  Viruses.  Fungi. Sometimes this condition occurs without infection. This may happen because of trauma or repetitive activities such as sports. What increases the risk? You are more likely to develop this condition if you have:  Unprotected sex with more than one partner.  Anal sex.  Recently had surgery.  A urinary catheter.  Urinary problems.  A suppressed immune system. What are the signs or symptoms? This condition usually begins suddenly with chills, fever, and pain behind the scrotum and in the testicle. Other symptoms include:  Swelling of the scrotum, testicle, or both.  Pain when ejaculating or urinating.  Pain in the back or  abdomen.  Nausea.  Itching and discharge from the penis.  A frequent need to pass urine.  Redness, increased warmth, and tenderness of the scrotum. How is this diagnosed? Your health care provider can diagnose this condition based on your symptoms and medical history. Your health care provider will also do a physical exam to ask about your symptoms and check your scrotum and testicle for swelling, pain, and redness. You may also have other tests, including:  Examination of discharge from the penis.  Urine tests for infections, such as STDs.  Ultrasound test for blood flow and inflammation. Your health care provider may test you for other STDs, including HIV. How is this treated? Treatment for this condition depends on the cause. If your condition is caused by a bacterial infection, oral antibiotic medicine may be prescribed. If the bacterial infection has spread to your blood, you may need to receive IV antibiotics. For both bacterial and nonbacterial epididymitis, you may be treated with:  Rest.  Elevation of the scrotum.  Pain medicines.  Anti-inflammatory medicines. Surgery may be needed to treat:  Bacterial epididymitis that causes pus to build up in the scrotum (abscess).  Chronic epididymitis that has not responded to other treatments. Follow these instructions at home: Medicines  Take over-the-counter and prescription medicines only as told by your health care provider.  If you were prescribed an antibiotic medicine, take it as told by your health care provider. Do not stop taking the antibiotic even if your condition improves. Sexual activity  If your epididymitis was caused by an STD, avoid sexual activity until your treatment is complete.  Inform your sexual partner or partners if you test positive for   an STD. They may need to be treated. Do not engage in sexual activity with your partner or partners until their treatment is completed. Managing pain and  swelling   If directed, elevate your scrotum and apply ice. ? Put ice in a plastic bag. ? Place a small towel or pillow between your legs. ? Rest your scrotum on the pillow or towel. ? Place another towel between your skin and the plastic bag. ? Leave the ice on for 20 minutes, 2-3 times a day.  Try taking a sitz bath to help with discomfort. This is a warm water bath that is taken while you are sitting down. The water should only come up to your hips and should cover your buttocks. Do this 3-4 times per day or as told by your health care provider.  Keep your scrotum elevated and supported while resting. Ask your health care provider if you should wear a scrotal support, such as a jockstrap. Wear it as told by your health care provider. General instructions  Return to your normal activities as told by your health care provider. Ask your health care provider what activities are safe for you.  Drink enough fluid to keep your urine pale yellow.  Keep all follow-up visits as told by your health care provider. This is important. Contact a health care provider if:  You have a fever.  Your pain medicine is not helping.  Your pain is getting worse.  Your symptoms do not improve within 3 days. Summary  Epididymitis is swelling (inflammation) or infection of the epididymis. This condition can also cause pain and swelling of the testicle and scrotum.  Treatment for this condition depends on the cause. If your condition is caused by a bacterial infection, oral antibiotic medicine may be prescribed.  Inform your sexual partner or partners if you test positive for an STD. They may need to be treated. Do not engage in sexual activity with your partner or partners until their treatment is completed.  Contact a health care provider if your symptoms do not improve within 3 days. This information is not intended to replace advice given to you by your health care provider. Make sure you discuss any  questions you have with your health care provider. Document Revised: 02/10/2018 Document Reviewed: 02/11/2018 Elsevier Patient Education  2020 Elsevier Inc.  

## 2019-06-03 ENCOUNTER — Telehealth: Payer: Self-pay

## 2019-06-03 LAB — URINE CYTOLOGY ANCILLARY ONLY
Chlamydia: NEGATIVE
Comment: NEGATIVE
Comment: NORMAL
Neisseria Gonorrhea: NEGATIVE

## 2019-06-03 NOTE — Telephone Encounter (Signed)
-----   Message from Virginia Crews, MD sent at 06/03/2019  1:33 PM EST ----- Normal urinalysis. Negative STD screening

## 2019-06-03 NOTE — Telephone Encounter (Signed)
[employer]   Result Notes and Comments to Patient Comment seen by patient Brandon Mcbride

## 2019-07-09 ENCOUNTER — Ambulatory Visit: Payer: BC Managed Care – PPO | Admitting: Urology

## 2019-08-05 ENCOUNTER — Ambulatory Visit (INDEPENDENT_AMBULATORY_CARE_PROVIDER_SITE_OTHER): Payer: BC Managed Care – PPO | Admitting: Family Medicine

## 2019-08-05 ENCOUNTER — Other Ambulatory Visit: Payer: Self-pay

## 2019-08-05 ENCOUNTER — Encounter: Payer: Self-pay | Admitting: Family Medicine

## 2019-08-05 VITALS — BP 129/76 | HR 63 | Temp 97.3°F | Wt 165.0 lb

## 2019-08-05 DIAGNOSIS — H6123 Impacted cerumen, bilateral: Secondary | ICD-10-CM

## 2019-08-05 DIAGNOSIS — J3489 Other specified disorders of nose and nasal sinuses: Secondary | ICD-10-CM

## 2019-08-05 DIAGNOSIS — J301 Allergic rhinitis due to pollen: Secondary | ICD-10-CM | POA: Diagnosis not present

## 2019-08-05 DIAGNOSIS — Z0282 Encounter for adoption services: Secondary | ICD-10-CM

## 2019-08-05 NOTE — Patient Instructions (Addendum)
Try OTC Debrox.   Allergic Rhinitis, Adult Allergic rhinitis is a reaction to allergens in the air. Allergens are tiny specks (particles) in the air that cause your body to have an allergic reaction. This condition cannot be passed from person to person (is not contagious). Allergic rhinitis cannot be cured, but it can be controlled. There are two types of allergic rhinitis:  Seasonal. This type is also called hay fever. It happens only during certain times of the year.  Perennial. This type can happen at any time of the year. What are the causes? This condition may be caused by:  Pollen from grasses, trees, and weeds.  House dust mites.  Pet dander.  Mold. What are the signs or symptoms? Symptoms of this condition include:  Sneezing.  Runny or stuffy nose (nasal congestion).  A lot of mucus in the back of the throat (postnasal drip).  Itchy nose.  Tearing of the eyes.  Trouble sleeping.  Being sleepy during day. How is this treated? There is no cure for this condition. You should avoid things that trigger your symptoms (allergens). Treatment can help to relieve symptoms. This may include:  Medicines that block allergy symptoms, such as antihistamines. These may be given as a shot, nasal spray, or pill.  Shots that are given until your body becomes less sensitive to the allergen (desensitization).  Stronger medicines, if all other treatments have not worked. Follow these instructions at home: Avoiding allergens   Find out what you are allergic to. Common allergens include smoke, dust, and pollen.  Avoid them if you can. These are some of the things that you can do to avoid allergens: ? Replace carpet with wood, tile, or vinyl flooring. Carpet can trap dander and dust. ? Clean any mold found in the home. ? Do not smoke. Do not allow smoking in your home. ? Change your heating and air conditioning filter at least once a month. ? During allergy season:  Keep  windows closed as much as you can. If possible, use air conditioning when there is a lot of pollen in the air.  Use a special filter for allergies with your furnace and air conditioner.  Plan outdoor activities when pollen counts are lowest. This is usually during the early morning or evening hours.  If you do go outdoors when pollen count is high, wear a special mask for people with allergies.  When you come indoors, take a shower and change your clothes before sitting on furniture or bedding. General instructions  Do not use fans in your home.  Do not hang clothes outside to dry.  Wear sunglasses to keep pollen out of your eyes.  Wash your hands right away after you touch household pets.  Take over-the-counter and prescription medicines only as told by your doctor.  Keep all follow-up visits as told by your doctor. This is important. Contact a doctor if:  You have a fever.  You have a cough that does not go away (is persistent).  You start to make whistling sounds when you breathe (wheeze).  Your symptoms do not get better with treatment.  You have thick fluid coming from your nose.  You start to have nosebleeds. Get help right away if:  Your tongue or your lips are swollen.  You have trouble breathing.  You feel dizzy or you feel like you are going to pass out (faint).  You have cold sweats. Summary  Allergic rhinitis is a reaction to allergens in the air.  This condition may be caused by allergens. These include pollen, dust mites, pet dander, and mold.  Symptoms include a runny, itchy nose, sneezing, or tearing eyes. You may also have trouble sleeping or feel sleepy during the day.  Treatment includes taking medicines and avoiding allergens. You may also get shots or take stronger medicines.  Get help if you have a fever or a cough that does not stop. Get help right away if you are short of breath. This information is not intended to replace advice given to  you by your health care provider. Make sure you discuss any questions you have with your health care provider. Document Revised: 07/29/2018 Document Reviewed: 10/29/2017 Elsevier Patient Education  [place].

## 2019-08-05 NOTE — Progress Notes (Signed)
I,Laura E Walsh,acting as a scribe for [name], MD.,have documented all relevant documentation on the behalf of [name], MD,as directed by  [name], MD while in the presence of [name], MD.  Established patient visit    Patient: Brandon Mcbride   DOB: [birth year]   29 y.o. Male  MRN: [id number] Visit Date: 08/05/2019  Today's healthcare provider: [name], MD  Subjective:    Chief Complaint  Patient presents with  . Form Completion  . Sinusitis   Pt is coming in today to complete an adoption form.  UTD on vaccines   Sinusitis This is a new problem. The problem is unchanged. There has been no fever. Associated symptoms include congestion and sinus pressure. Pertinent negatives include no ear pain, headaches, neck pain, shortness of breath, sneezing, sore throat or swollen glands. Past treatments include oral decongestants (Ibuprofen). The treatment provided mild relief.   Patient Active Problem List   Diagnosis Date Noted  . 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 WISDOM TOOTH EXTRACTION     Social History   Tobacco Use  . Smoking status: Never Smoker  . Smokeless tobacco: Never Used  Substance Use Topics  . Alcohol use: Yes    Alcohol/week: 8.0 standard drinks    Types: 8 Standard drinks or equivalent per week  . Drug use: Never   No Known Allergies     Medications: Outpatient Medications Prior to Visit  Medication Sig  . fexofenadine (ALLEGRA) 180 MG tablet Take 180 mg by mouth daily.  Marland Kitchen esomeprazole (NEXIUM) 20 MG packet   . [DISCONTINUED] levofloxacin (LEVAQUIN) 500 MG tablet Take 1 tablet (500 mg total) by mouth daily.   No facility-administered medications prior to visit.    Review of Systems  Constitutional: Negative.   HENT: Positive for congestion, postnasal drip (Mild) and sinus pressure. Negative for ear discharge, ear pain,  rhinorrhea, sinus pain, sneezing, sore throat, tinnitus and voice change.   Eyes: Negative.   Respiratory: Negative.  Negative for shortness of breath.   Cardiovascular: Negative.   Gastrointestinal: Negative.   Musculoskeletal: Negative for neck pain.  Allergic/Immunologic: Positive for environmental allergies (Seasonal allergies.).  Neurological: Positive for light-headedness. Negative for dizziness and headaches.        Objective:    BP 129/76 (BP Location: Right Arm, Patient Position: Sitting, Cuff Size: Normal)   Pulse 63   Temp (!) 97.3 F (36.3 C) (Temporal)   Wt 165 lb (74.8 kg)   BMI 21.77 kg/m   Physical Exam Vitals and nursing note reviewed.  Constitutional:      Appearance: Normal appearance.  HENT:     Head: Normocephalic and atraumatic.     Right Ear: External ear normal. There is impacted cerumen.     Left Ear: External ear normal. There is impacted cerumen.     Nose: Nose normal.  Eyes:     Conjunctiva/sclera: Conjunctivae normal.  Cardiovascular:     Rate and Rhythm: Normal rate and regular rhythm.     Heart sounds: Normal heart sounds. No murmur.  Pulmonary:     Effort: Pulmonary effort is normal. No respiratory distress.     Breath sounds: Normal breath sounds. No wheezing.  Neurological:     Mental Status: He is alert and oriented to person, place, and time. Mental status is at baseline.  Psychiatric:        Mood and Affect: Mood normal.  Behavior: Behavior normal.        Thought Content: Thought content normal.        Judgment: Judgment normal.       No results found for any visits on 08/05/19.    Assessment & Plan:    1.  Preadoption visit for adoptive parent Adoption form completed and returned to pt.  Copy made for chart. No reservations for recommending for adoption  2. Seasonal allergic rhinitis due to pollen Continue uses Allegra Add OTC Flonase Advised pt to call if not improved or worsening symptoms.   3. Bilateral  impacted cerumen Start Debrox  Can return for disimpaction if needed  4. Sinus pressure Continue using Ibuprofen as needed.  No signs of bacterial sinusitis at this time Seems related to allergic rhinitis  Return if symptoms worsen or fail to improve.     I, [name], MD, have reviewed all documentation for this visit. The documentation on 08/05/19 for the exam, diagnosis, procedures, and orders are all accurate and complete.   Maynard David, Dionne Bucy, MD, MPH [facility]

## 2019-08-06 ENCOUNTER — Encounter: Payer: Self-pay | Admitting: Urology

## 2019-08-06 ENCOUNTER — Ambulatory Visit (INDEPENDENT_AMBULATORY_CARE_PROVIDER_SITE_OTHER): Payer: BC Managed Care – PPO | Admitting: Urology

## 2019-08-06 ENCOUNTER — Other Ambulatory Visit: Payer: Self-pay

## 2019-08-06 VITALS — BP 129/72 | HR 58 | Ht 73.0 in | Wt 164.7 lb

## 2019-08-06 DIAGNOSIS — N5082 Scrotal pain: Secondary | ICD-10-CM

## 2019-08-06 DIAGNOSIS — R102 Pelvic and perineal pain: Secondary | ICD-10-CM

## 2019-08-06 LAB — URINALYSIS, COMPLETE
Bilirubin, UA: NEGATIVE
Glucose, UA: NEGATIVE
Ketones, UA: NEGATIVE
Leukocytes,UA: NEGATIVE
Nitrite, UA: NEGATIVE
Protein,UA: NEGATIVE
RBC, UA: NEGATIVE
Specific Gravity, UA: 1.01 (ref 1.005–1.030)
Urobilinogen, Ur: 0.2 mg/dL (ref 0.2–1.0)
pH, UA: 6 (ref 5.0–7.5)

## 2019-08-06 NOTE — Patient Instructions (Signed)
Check out [email or web address] for posterior chain/posture exercises.  Pelvic Pain, Male Pelvic pain is pain in your lower abdomen, below your belly button and between your hips. The pain may start suddenly (be acute), keep coming back (recur), or last a long time (become chronic). Pelvic pain that lasts longer than six months is considered chronic. There are many possible causes of pelvic pain. Sometimes, the cause is not known. Pelvic pain may affect your:  Prostate gland.  Urinary system.  Digestive tract.  Musculoskeletal system. Strained muscles or ligaments may cause pelvic pain. Follow these instructions at home:  Medicines  Take over-the-counter and prescription medicines only as told by your health care provider.  If you were prescribed an antibiotic medicine, take it as told by your health care provider. Do not stop taking the antibiotic even if you start to feel better. Managing pain, stiffness, and swelling   Take warm water baths (sitz baths). Sitz baths help with relaxing your pelvic floor muscles. ? For a sitz bath, the water only comes up to your hips and covers your buttocks. A sitz bath may done at home in a bathtub or with a portable sitz bath that fits over the toilet.  If directed, apply heat to the affected area before you exercise. Use the heat source that your health care provider recommends, such as a moist heat pack or a heating pad. ? Place a towel between your skin and the heat source. ? Leave the heat on for 20-30 minutes. ? Remove the heat if your skin turns bright red. This is especially important if you are unable to feel pain, heat, or cold. You may have a greater risk of getting burned. General instructions  Rest as told by your health care provider.  Keep a journal of your pelvic pain. Write down: ? When the pain started. ? Where the pain is located. ? What seems to make the pain better or worse. ? Any symptoms you have along with the  pain.  Follow your treatment plan as told by your health care provider. This may include: ? Pelvic physical therapy. ? Yoga, meditation, and exercise. ? Biofeedback. This process trains you to manage your body's response (physiological response) through breathing techniques and relaxation methods. You will work with a therapist while machines are used to monitor your physical symptoms. ? Acupuncture. This is a type of treatment that involves stimulating specific points on your body by inserting thin needles through your skin to treat pain.  Keep all follow-up visits as told by your health care provider. This is important. Contact a health care provider if:  Medicine does not help your pain.  Your pain comes back.  You have new symptoms.  You have a fever or chills.  You are constipated.  You have blood in your urine or stool.  You feel weak or light-headed. Get help right away if:  You have sudden severe pain.  Your pain steadily gets worse.  You have severe pain along with fever, nausea, vomiting, or excessive sweating. Summary  Pelvic pain is pain in your lower abdomen, below your belly button and between your hips. There are many possible causes of pelvic pain. Sometimes, the cause is not known.  Take over-the-counter and prescription medicines only as told by your health care provider. If you were prescribed an antibiotic medicine, take it as told by your health care provider. Do not stop taking the antibiotic even if you start to feel better.  Contact a health care  provider if you have new or worsening symptoms.  Get help right away if you have severe pain along with fever, nausea, vomiting, or excessive sweating.  Keep all follow-up visits as told by your health care provider. This is important. This information is not intended to replace advice given to you by your health care provider. Make sure you discuss any questions you have with your health care  provider. Document Revised: 08/28/2017 Document Reviewed: 08/28/2017 Elsevier Patient Education  [place].

## 2019-08-06 NOTE — Progress Notes (Signed)
   08/06/19 1:59 PM   [name] [birth year] [id number]  CC: Scrotal pain  HPI: I saw Mr. Zoss in urology clinic today for right-sided scrotal pain.  He initially noticed this in the beginning of January 2021, and it acutely worsened after a hike with his husband in early February.  He was seen by his PCP and urinalysis was benign but his exam was worrisome for epididymitis, and he was started on a 10-day course of Levaquin.  STD testing was negative at that visit.  He reports the antibiotics significantly improve his right scrotal pain.  He started wearing snug fitting underwear which did help, and his symptoms of them was completely resolved over the last few weeks.  He had tried NSAIDs as well which did not help significantly.  He denies any urinary symptoms, urethral discharge, or gross hematuria.  He has a history of low midline pelvic pain previously evaluated by urologist in [place], and he was seen by pelvic floor physical therapist at that time which he did feel helped.  Urinalysis benign today with 0 RBCs, 0 WBCs, no bacteria, nitrite negative.  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  Surgical History: Past Surgical History:  Procedure Laterality Date  . WISDOM TOOTH EXTRACTION     Family History: Family History  Problem Relation Age of Onset  . Healthy Mother   . Hypertension Father   . Hyperlipidemia Father   . Liver cancer Paternal Uncle   . Breast cancer Maternal Grandmother 60  . Esophageal cancer Maternal Grandmother   . Alzheimer's disease Paternal Grandmother   . Parkinson's disease Paternal Grandfather   . ADD / ADHD Brother     Social History:  reports that he has never smoked. He has never used smokeless tobacco. He reports current alcohol use of about 8.0 standard drinks of alcohol per week. He reports that he does not use drugs.  Physical Exam: BP 129/72 (BP Location: Left Arm, Patient Position: Sitting, Cuff Size:  Normal)   Pulse (!) 58   Ht 6\' 1"  (1.854 m)   Wt 164 lb 11.2 oz (74.7 kg)   BMI 21.73 kg/m    Constitutional:  Alert and oriented, No acute distress. Respiratory: Normal respiratory effort, no increased work of breathing. GI: Abdomen is soft, nontender, nondistended, no abdominal masses GU: Circumcised phallus with widely patent meatus, no discharge or penile lesions.  Testicles 20 cc and descended bilaterally without masses, non-tender tender on exam.  Laboratory Data: Reviewed, see HPI  Pertinent Imaging: None to review  Assessment & Plan:   In summary, he is a healthy 29 year old male with 3 months of intermittent right-sided scrotal pain that is almost completely resolved with snug fitting underwear.  Infectious work-up was negative.  Physical exam is benign.  We discussed the complex nature of scrotal and pelvic pain, and possible etiologies including torsion, infection, inflammation, and idiopathic.  We discussed considering a scrotal ultrasound to rule out any underlying pathology, but he would like to hold off at this time which is very reasonable with his benign exam.  We discussed return precautions at length including abnormal testicular exam, fevers, urinary symptoms, or worsening pain.  I think he would benefit from a referral to pelvic floor physical therapy if he has recurrence of his pelvic or scrotal pain.  Follow-up as needed  Nickolas Madrid, MD 08/06/2019  [facility] [address], [place],  [address] [phone]

## 2019-08-17 ENCOUNTER — Ambulatory Visit: Payer: BC Managed Care – PPO | Admitting: Family Medicine

## 2019-08-26 ENCOUNTER — Other Ambulatory Visit: Payer: Self-pay

## 2019-08-26 ENCOUNTER — Encounter: Payer: Self-pay | Admitting: Family Medicine

## 2019-08-26 ENCOUNTER — Ambulatory Visit (INDEPENDENT_AMBULATORY_CARE_PROVIDER_SITE_OTHER): Payer: BC Managed Care – PPO | Admitting: Family Medicine

## 2019-08-26 VITALS — BP 120/77 | HR 73 | Temp 97.3°F | Wt 164.0 lb

## 2019-08-26 DIAGNOSIS — K648 Other hemorrhoids: Secondary | ICD-10-CM | POA: Diagnosis not present

## 2019-08-26 NOTE — Assessment & Plan Note (Signed)
Found on exam. Discussed ways to prevent a flare.

## 2019-08-26 NOTE — Patient Instructions (Signed)
Hemorrhoids Hemorrhoids are swollen veins that may develop:  In the butt (rectum). These are called internal hemorrhoids.  Around the opening of the butt (anus). These are called external hemorrhoids. Hemorrhoids can cause pain, itching, or bleeding. Most of the time, they do not cause serious problems. They usually get better with diet changes, lifestyle changes, and other home treatments. What are the causes? This condition may be caused by:  Having trouble pooping (constipation).  Pushing hard (straining) to poop.  Watery poop (diarrhea).  Pregnancy.  Being very overweight (obese).  Sitting for long periods of time.  Heavy lifting or other activity that causes you to strain.  Anal sex.  Riding a bike for a long period of time. What are the signs or symptoms? Symptoms of this condition include:  Pain.  Itching or soreness in the butt.  Bleeding from the butt.  Leaking poop.  Swelling in the area.  One or more lumps around the opening of your butt. How is this diagnosed? A doctor can often diagnose this condition by looking at the affected area. The doctor may also:  Do an exam that involves feeling the area with a gloved hand (digital rectal exam).  Examine the area inside your butt using a small tube (anoscope).  Order blood tests. This may be done if you have lost a lot of blood.  Have you get a test that involves looking inside the colon using a flexible tube with a camera on the end (sigmoidoscopy or colonoscopy). How is this treated? This condition can usually be treated at home. Your doctor may tell you to change what you eat, make lifestyle changes, or try home treatments. If these do not help, procedures can be done to remove the hemorrhoids or make them smaller. These may involve:  Placing rubber bands at the base of the hemorrhoids to cut off their blood supply.  Injecting medicine into the hemorrhoids to shrink them.  Shining a type of light  energy onto the hemorrhoids to cause them to fall off.  Doing surgery to remove the hemorrhoids or cut off their blood supply. Follow these instructions at home: Eating and drinking   Eat foods that have a lot of fiber in them. These include whole grains, beans, nuts, fruits, and vegetables.  Ask your doctor about taking products that have added fiber (fibersupplements).  Reduce the amount of fat in your diet. You can do this by: ? Eating low-fat dairy products. ? Eating less red meat. ? Avoiding processed foods.  Drink enough fluid to keep your pee (urine) pale yellow. Managing pain and swelling   Take a warm-water bath (sitz bath) for 20 minutes to ease pain. Do this 3-4 times a day. You may do this in a bathtub or using a portable sitz bath that fits over the toilet.  If told, put ice on the painful area. It may be helpful to use ice between your warm baths. ? Put ice in a plastic bag. ? Place a towel between your skin and the bag. ? Leave the ice on for 20 minutes, 2-3 times a day. General instructions  Take over-the-counter and prescription medicines only as told by your doctor. ? Medicated creams and medicines may be used as told.  Exercise often. Ask your doctor how much and what kind of exercise is best for you.  Go to the bathroom when you have the urge to poop. Do not wait.  Avoid pushing too hard when you poop.  Keep your   butt dry and clean. Use wet toilet paper or moist towelettes after pooping.  Do not sit on the toilet for a long time.  Keep all follow-up visits as told by your doctor. This is important. Contact a doctor if you:  Have pain and swelling that do not get better with treatment or medicine.  Have trouble pooping.  Cannot poop.  Have pain or swelling outside the area of the hemorrhoids. Get help right away if you have:  Bleeding that will not stop. Summary  Hemorrhoids are swollen veins in the butt or around the opening of the  butt.  They can cause pain, itching, or bleeding.  Eat foods that have a lot of fiber in them. These include whole grains, beans, nuts, fruits, and vegetables.  Take a warm-water bath (sitz bath) for 20 minutes to ease pain. Do this 3-4 times a day. This information is not intended to replace advice given to you by your health care provider. Make sure you discuss any questions you have with your health care provider. Document Revised: 04/17/2018 Document Reviewed: 08/29/2017 Elsevier Patient Education  2020 [employer] Inc[employer]  

## 2019-08-26 NOTE — Progress Notes (Signed)
    I,Laura E Walsh,acting as a scribe for [name], MD.,have documented all relevant documentation on the behalf of [name], MD,as directed by  [name], MD while in the presence of [name], MD.   Established patient visit   Patient: Brandon Mcbride   DOB: [birth year]   29 y.o. Male  MRN: [id number] Visit Date: 08/26/2019  Today's healthcare provider: [name], MD   Chief Complaint  Patient presents with  . Cyst   Subjective    HPI Pt noticed a "lump"  (pea sized) in his anus on 08/24/2019.  He denies pain, drainage, changes in his bowels or bleeding.  Pt states over the past week it has gotten smaller.   Patient Active Problem List   Diagnosis Date Noted  . Internal hemorrhoid 08/26/2019  . 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 No pertinent past medical history. No Known Allergies   Medications: Outpatient Medications Prior to Visit  Medication Sig  . esomeprazole (NEXIUM) 20 MG packet   . fexofenadine (ALLEGRA) 180 MG tablet Take 180 mg by mouth daily.   No facility-administered medications prior to visit.    Review of Systems  Constitutional: Negative.   Gastrointestinal: Negative.       Objective    BP 120/77 (BP Location: Left Arm, Patient Position: Sitting, Cuff Size: Normal)   Pulse 73   Temp (!) 97.3 F (36.3 C) (Temporal)   Wt 164 lb (74.4 kg)   BMI 21.64 kg/m    Physical Exam Constitutional:      Appearance: Normal appearance. He is normal weight.  Genitourinary:    Rectum: Internal hemorrhoid present. No mass, tenderness or external hemorrhoid.  Skin:    General: Skin is warm and dry.  Neurological:     Mental Status: He is oriented to person, place, and time. Mental status is at baseline.  Psychiatric:        Mood and Affect: Mood normal.        Behavior: Behavior normal.        Thought Content: Thought content normal.        Judgment: Judgment normal.      No results found for any visits on 08/26/19.  Assessment & Plan     1. Internal hemorrhoid - new problem - reassured about benign nature - discussed avoiding constipation, diarrhea, and proper positioning for toileting - discussed return precautions  Return if symptoms worsen or fail to improve.      I, [name], MD, have reviewed all documentation for this visit. The documentation on 08/26/19 for the exam, diagnosis, procedures, and orders are all accurate and complete.   Jenafer Winterton, Dionne Bucy, MD, MPH [facility]

## 2019-12-01 ENCOUNTER — Encounter: Payer: Self-pay | Admitting: Family Medicine

## 2019-12-10 DIAGNOSIS — H5203 Hypermetropia, bilateral: Secondary | ICD-10-CM | POA: Diagnosis not present

## 2020-01-13 DIAGNOSIS — Z23 Encounter for immunization: Secondary | ICD-10-CM | POA: Diagnosis not present

## 2020-02-10 ENCOUNTER — Other Ambulatory Visit: Payer: Self-pay

## 2020-02-10 ENCOUNTER — Encounter: Payer: Self-pay | Admitting: Physician Assistant

## 2020-02-10 ENCOUNTER — Ambulatory Visit (INDEPENDENT_AMBULATORY_CARE_PROVIDER_SITE_OTHER): Payer: BC Managed Care – PPO | Admitting: Physician Assistant

## 2020-02-10 VITALS — BP 118/74 | HR 68 | Temp 98.1°F | Ht 73.0 in

## 2020-02-10 DIAGNOSIS — N50811 Right testicular pain: Secondary | ICD-10-CM

## 2020-02-11 LAB — URINALYSIS, COMPLETE
Bilirubin, UA: NEGATIVE
Glucose, UA: NEGATIVE
Ketones, UA: NEGATIVE
Leukocytes,UA: NEGATIVE
Nitrite, UA: NEGATIVE
Protein,UA: NEGATIVE
RBC, UA: NEGATIVE
Specific Gravity, UA: 1.025 (ref 1.005–1.030)
Urobilinogen, Ur: 0.2 mg/dL (ref 0.2–1.0)
pH, UA: 7 (ref 5.0–7.5)

## 2020-02-11 LAB — MICROSCOPIC EXAMINATION
Bacteria, UA: NONE SEEN
Epithelial Cells (non renal): NONE SEEN /hpf (ref 0–10)
RBC: NONE SEEN /hpf (ref 0–2)

## 2020-02-11 NOTE — Progress Notes (Signed)
02/10/2020 3:51 PM   [name] 1990-12-23 [id number]  CC: Chief Complaint  Patient presents with  . Other    Testicular pain   HPI: Brandon Mcbride is a 29 y.o. male with a history of intermittent right-sided scrotal pain with a negative infectious work-up who presents today for evaluation of recurrent right sided scrotal pain.    Today he reports an approximate 6-week history of right-sided epididymal pain.  He states the pain occurred suddenly while he was on a walk and felt like he was "being kicked in the testicle."  The pain is since decreased in intensity but remains persistent.  He describes it as a low level soreness localized to the superior right epididymis.  On palpation, he feels that there may be a nodule in the epididymis associated with his pain.  Additionally, he reports occasional soreness in the right groin.  Symptoms are not exacerbated or alleviated with sexual activity.  He has taken ibuprofen and Aleve with no symptom palliation.  In-office UA and microscopy today pan negative.  PMH: No past medical history on file.  Surgical History: Past Surgical History:  Procedure Laterality Date  . WISDOM TOOTH EXTRACTION      Home Medications:  Allergies as of 02/10/2020   No Known Allergies     Medication List       Accurate as of February 10, 2020 11:59 PM. If you have any questions, ask your nurse or doctor.        STOP taking these medications   fexofenadine 180 MG tablet Commonly known as: ALLEGRA Stopped by: Debroah Loop, PA-C     TAKE these medications   esomeprazole 20 MG packet Commonly known as: NEXIUM       Allergies:  No Known Allergies  Family History: Family History  Problem Relation Age of Onset  . Healthy Mother   . Hypertension Father   . Hyperlipidemia Father   . Liver cancer Paternal Uncle   . Breast cancer Maternal Grandmother 60  . Esophageal cancer Maternal  Grandmother   . Alzheimer's disease Paternal Grandmother   . Parkinson's disease Paternal Grandfather   . ADD / ADHD Brother     Social History:   reports that he has never smoked. He has never used smokeless tobacco. He reports current alcohol use of about 8.0 standard drinks of alcohol per week. He reports that he does not use drugs.  Physical Exam: BP 118/74   Pulse 68   Temp 98.1 F (36.7 C)   Ht 6\' 1"  (1.854 m)   BMI 21.64 kg/m   Constitutional:  Alert and oriented, no acute distress, nontoxic appearing HEENT: [place], AT Cardiovascular: No clubbing, cyanosis, or edema Respiratory: Normal respiratory effort, no increased work of breathing GU: Patent urethral meatus, bilateral descended testicles with intact vasa.  Slight fullness and nodular quality of the superior right epididymis.  No fluctuance. Skin: No rashes, bruises or suspicious lesions Neurologic: Grossly intact, no focal deficits, moving all 4 extremities Psychiatric: Normal mood and affect  Laboratory Data: Results for orders placed or performed in visit on 02/10/20  Microscopic Examination   Urine  Result Value Ref Range   WBC, UA 0-5 0 - 5 /hpf   RBC None seen 0 - 2 /hpf   Epithelial Cells (non renal) None seen 0 - 10 /hpf   Bacteria, UA None seen None seen/Few  Urinalysis, Complete  Result Value Ref Range   Specific Gravity, UA 1.025 1.005 -  1.030   pH, UA 7.0 5.0 - 7.5   Color, UA Yellow Yellow   Appearance Ur Hazy (A) Clear   Leukocytes,UA Negative Negative   Protein,UA Negative Negative/Trace   Glucose, UA Negative Negative   Ketones, UA Negative Negative   RBC, UA Negative Negative   Bilirubin, UA Negative Negative   Urobilinogen, Ur 0.2 0.2 - 1.0 mg/dL   Nitrite, UA Negative Negative   Microscopic Examination See below:    Assessment & Plan:   1. Right testicular pain Recurrent despite previously negative infectious work-up.  UA today benign.  Will complete epididymitis work-up with GC  testing.  At this point, I recommend scrotal and renal ultrasound to evaluate for structural abnormalities as well as silent hydronephrosis.  Given his subtle nodularity of the right epididymis, I suspect a small epididymal cyst.  If imaging work-up is benign, recommend following up with pelvic floor physical therapy, as it appears at least 2 episodes of his right testicular pain have been associated with ambulation. - Urinalysis, Complete - Ct Ng M genitalium NAA, Urine - US RENAL; Future - US SCROTUM W/DOPPLER; Future  Return for Will call with results.  Debroah Loop, PA-C  [facility] [address] [place], [place] [address] [phone]

## 2020-02-16 LAB — CT NG M GENITALIUM NAA, URINE
Chlamydia trachomatis, NAA: NEGATIVE
Mycoplasma genitalium NAA: NEGATIVE
Neisseria gonorrhoeae, NAA: NEGATIVE

## 2020-02-23 ENCOUNTER — Encounter: Payer: Self-pay | Admitting: Family Medicine

## 2020-02-23 DIAGNOSIS — R3915 Urgency of urination: Secondary | ICD-10-CM | POA: Diagnosis not present

## 2020-02-24 ENCOUNTER — Ambulatory Visit
Admission: RE | Admit: 2020-02-24 | Discharge: 2020-02-24 | Disposition: A | Discharge status: Home / Self Care | Payer: BC Managed Care – PPO | Source: Ambulatory Visit | Attending: Urology | Admitting: Urology

## 2020-02-24 ENCOUNTER — Ambulatory Visit
Admission: RE | Admit: 2020-02-24 | Discharge: 2020-02-24 | Disposition: A | Discharge status: Home / Self Care | Payer: BC Managed Care – PPO | Attending: Urology | Admitting: Urology

## 2020-02-24 ENCOUNTER — Encounter: Payer: Self-pay | Admitting: Urology

## 2020-02-24 ENCOUNTER — Other Ambulatory Visit: Payer: Self-pay

## 2020-02-24 ENCOUNTER — Ambulatory Visit (INDEPENDENT_AMBULATORY_CARE_PROVIDER_SITE_OTHER): Payer: BC Managed Care – PPO | Admitting: Urology

## 2020-02-24 VITALS — BP 124/75 | HR 57 | Ht 73.0 in | Wt 163.0 lb

## 2020-02-24 DIAGNOSIS — R109 Unspecified abdominal pain: Secondary | ICD-10-CM | POA: Diagnosis not present

## 2020-02-24 DIAGNOSIS — R300 Dysuria: Secondary | ICD-10-CM

## 2020-02-24 DIAGNOSIS — R102 Pelvic and perineal pain: Secondary | ICD-10-CM

## 2020-02-24 LAB — BLADDER SCAN AMB NON-IMAGING

## 2020-02-24 NOTE — Progress Notes (Signed)
02/24/2020 7:54 PM   Brandon Mcbride [birth year] [id number]  Referring provider: Virginia Crews, MD [address] [place],  [place] [address]  Chief Complaint  Patient presents with  . Pelvic Pain    HPI: Brandon Mcbride is a 28 y.o. male with chronic pelvic pain who presents today requesting an urgent appointment.  He was last seen in our office on February 10, 2020 by Thomas Hoff, PA-C.  At that visit he was having right-sided epididymal pain and a nodule in his epididymis.  He is currently scheduled for a renal and scrotal ultrasound.  These imaging studies are currently scheduled for November 17.  He is now experiencing dysuria, urgency and increased urinary frequency.  Patient denies any modifying or aggravating factors.  Patient denies any gross hematuria or flank pain.  Patient denies any fevers, chills, nausea or vomiting.   He states the testicular pain has somewhat improved.    He was seen at [facility] clinic urgent care yesterday and was prescribed Cipro for 7 days.  He states the burning has actually improved since being on the Cipro.  UA is negative.    PVR is 11 mL.  KUB with possible bladder stone.   PMH: No past medical history on file.  Surgical History: Past Surgical History:  Procedure Laterality Date  . WISDOM TOOTH EXTRACTION      Home Medications:  Allergies as of 02/24/2020   No Known Allergies     Medication List       Accurate as of February 24, 2020 11:59 PM. If you have any questions, ask your nurse or doctor.        esomeprazole 20 MG packet Commonly known as: NEXIUM       Allergies: No Known Allergies  Family History: Family History  Problem Relation Age of Onset  . Healthy Mother   . Hypertension Father   . Hyperlipidemia Father   . Liver cancer Paternal Uncle   . Breast cancer Maternal Grandmother 60  . Esophageal cancer Maternal Grandmother   . Alzheimer's  disease Paternal Grandmother   . Parkinson's disease Paternal Grandfather   . ADD / ADHD Brother     Social History:  reports that he has never smoked. He has never used smokeless tobacco. He reports current alcohol use of about 8.0 standard drinks of alcohol per week. He reports that he does not use drugs.  ROS: Pertinent ROS in HPI  Physical Exam: BP 124/75 (BP Location: Left Arm, Patient Position: Sitting, Cuff Size: Normal)   Pulse (!) 57   Ht 6\' 1"  (1.854 m)   Wt 163 lb (73.9 kg)   BMI 21.51 kg/m   Constitutional:  Well nourished. Alert and oriented, No acute distress. HEENT: [place] AT, mask in place.  Trachea midline Cardiovascular: No clubbing, cyanosis, or edema. Respiratory: Normal respiratory effort, no increased work of breathing. Neurologic: Grossly intact, no focal deficits, moving all 4 extremities. Psychiatric: Normal mood and affect.  Laboratory Data: Lab Results  Component Value Date   WBC 4.8 12/31/2017   HGB 14.9 12/31/2017   HCT 44.0 12/31/2017   MCV 85 12/31/2017   PLT 201 12/31/2017    Lab Results  Component Value Date   CREATININE 1.06 12/31/2017    Lab Results  Component Value Date   TSH 1.920 12/31/2017       Component Value Date/Time   CHOL 136 12/31/2017 0750   HDL 44 12/31/2017 0750   CHOLHDL  3.1 12/31/2017 0750   LDLCALC 77 12/31/2017 0750    Lab Results  Component Value Date   AST 14 12/31/2017   Lab Results  Component Value Date   ALT 18 12/31/2017    Urinalysis Component     Latest Ref Rng & Units 02/24/2020  Specific Gravity, UA     1.005 - 1.030 1.015  pH, UA     5.0 - 7.5 7.0  Color, UA     Yellow Yellow  Appearance Ur     Clear Clear  Leukocytes,UA     Negative Negative  Protein,UA     Negative/Trace Negative  Glucose, UA     Negative Negative  Ketones, UA     Negative Negative  RBC, UA     Negative Negative  Bilirubin, UA     Negative Negative  Urobilinogen, Ur     0.2 - 1.0 mg/dL 0.2  Nitrite, UA      Negative Negative  Microscopic Examination      See below:   Component     Latest Ref Rng & Units 02/24/2020  WBC, UA     0 - 5 /hpf None seen  RBC     0 - 2 /hpf None seen  Epithelial Cells (non renal)     0 - 10 /hpf None seen  Bacteria, UA     None seen/Few None seen    I have reviewed the labs.   Pertinent Imaging: Narrative & Impression  CLINICAL DATA:  Flank pain.  EXAM: ABDOMEN - 1 VIEW  COMPARISON:  None.  FINDINGS: The bowel gas pattern is normal. No radio-opaque calculi identified, although overlapping bowel gas limits evaluation of the left kidney and bilateral ureters. No other significant radiographic abnormality are seen.  IMPRESSION: No acute findings.   Electronically Signed   By: Margaretha Sheffield MD   On: 02/26/2020 15:24  I have independently reviewed the films.  See HPI.    Assessment & Plan:    1. Pelvic pain - Urinalysis, Complete - Bladder Scan (Post Void Residual) in office UA, mycoplasma genitalium, chlamydia and gonorrhea cultures were negative KUB taken today with a questionable calcification located in the pelvis which may represent a bladder stone, ultrasounds are pending We were able to move up the renal ultrasound and scrotal ultrasounds to November 9 and follow-up will be based on these findings  2. Dysuria He will continue the Cipro at this time, we may choose to extend the antibiotic course pending ultrasound findings If ultrasounds are negative and dysuria persists even with the completion of Cipro, we may need to pursue cystoscopy for further evaluation    Return for Pending ultrasound results .  These notes generated with voice recognition software. I apologize for typographical errors.  Zara Council, PA-C  [facility] [address]   [place], [place] [address] [phone]

## 2020-02-26 LAB — URINALYSIS, COMPLETE
Bilirubin, UA: NEGATIVE
Glucose, UA: NEGATIVE
Ketones, UA: NEGATIVE
Leukocytes,UA: NEGATIVE
Nitrite, UA: NEGATIVE
Protein,UA: NEGATIVE
RBC, UA: NEGATIVE
Specific Gravity, UA: 1.015 (ref 1.005–1.030)
Urobilinogen, Ur: 0.2 mg/dL (ref 0.2–1.0)
pH, UA: 7 (ref 5.0–7.5)

## 2020-02-26 LAB — MICROSCOPIC EXAMINATION
Bacteria, UA: NONE SEEN
Epithelial Cells (non renal): NONE SEEN /hpf (ref 0–10)
RBC: NONE SEEN /hpf (ref 0–2)
WBC, UA: NONE SEEN /hpf (ref 0–5)

## 2020-03-01 ENCOUNTER — Ambulatory Visit
Admission: RE | Admit: 2020-03-01 | Discharge: 2020-03-01 | Disposition: A | Discharge status: Home / Self Care | Payer: BC Managed Care – PPO | Source: Ambulatory Visit | Attending: Physician Assistant | Admitting: Physician Assistant

## 2020-03-01 ENCOUNTER — Other Ambulatory Visit: Payer: Self-pay

## 2020-03-01 DIAGNOSIS — N50811 Right testicular pain: Secondary | ICD-10-CM | POA: Insufficient documentation

## 2020-03-01 DIAGNOSIS — R109 Unspecified abdominal pain: Secondary | ICD-10-CM | POA: Diagnosis not present

## 2020-03-08 ENCOUNTER — Other Ambulatory Visit: Payer: Self-pay | Admitting: Physician Assistant

## 2020-03-08 DIAGNOSIS — R102 Pelvic and perineal pain: Secondary | ICD-10-CM

## 2020-03-09 ENCOUNTER — Other Ambulatory Visit: Payer: BC Managed Care – PPO

## 2020-04-12 ENCOUNTER — Ambulatory Visit: Payer: BC Managed Care – PPO | Attending: Physician Assistant | Admitting: Physical Therapy

## 2020-04-12 ENCOUNTER — Other Ambulatory Visit: Payer: Self-pay

## 2020-04-12 ENCOUNTER — Encounter: Payer: Self-pay | Admitting: Physical Therapy

## 2020-04-12 DIAGNOSIS — R293 Abnormal posture: Secondary | ICD-10-CM

## 2020-04-12 DIAGNOSIS — R278 Other lack of coordination: Secondary | ICD-10-CM

## 2020-04-12 DIAGNOSIS — M533 Sacrococcygeal disorders, not elsewhere classified: Secondary | ICD-10-CM | POA: Diagnosis not present

## 2020-04-12 NOTE — Therapy (Signed)
[place] MAIN [facility] SERVICES [address] [place], [place], [address] Phone: [phone]   Fax:  [phone]  Physical Therapy Evaluation  Patient Details  Name: Brandon Mcbride MRN: [id number] Date of Birth: 1990/05/04 Referring Provider (PT): Vaillancourt   Encounter Date: 04/12/2020   PT End of Session - 04/12/20 1742    Visit Number 1    Number of Visits 10    Date for PT Re-Evaluation 06/21/20    PT Start Time 1400    PT Stop Time 1500    PT Time Calculation (min) 60 min    Activity Tolerance Patient tolerated treatment well    Behavior During Therapy [facility] for tasks assessed/performed           History reviewed. No pertinent past medical history.  Past Surgical History:  Procedure Laterality Date  . WISDOM TOOTH EXTRACTION      There were no vitals filed for this visit.    Subjective Assessment - 04/12/20 1429    Subjective 1) R Pubic pain and testicular pain: In 2017, pt felt pubic pain with UTI like Sx and results came back neg. Pt was Dx with IC. Pt got a 2nd opinion which explained the issue was musculoskeletal and was referred to Pelvic PT. Pt do not remember what exercises he was given and had lost the exercise sheet. The issues resolved without doing anything. Beginning of 04/24/2019, pt started to have R testicular pain at epididymis. Pain comes and goes  throughout day 4/10  and with bending over, it is worst 6/10.   Pt took antibiotics which did not help.  In October, he had a flare up in the morning when walking and had to tie his shoe with bending and crouching over. This episode caused 9/10 pain and he had trouble walking. Urologist thought it was a recurrent infection. Eventually, he felt the pelvic soreness on the R pubic bone or slightly to the R. Nothing eases this soreness. Driving or anything that causes he to tighten the area will cause the pain. Early Nov, the pain got to the point, pt had UTI Sx  again except this time, urine was coming back very high pH levels. 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  Less effect on pubic pain. X rays , [place] scans were normal.    2) Difficulty with initiating urine and post urine dribble:  pt reports he has to do more work to relax to start urination and  lots of dribble     Pertinent History Denied pain with erection./ ejaculation , LBP, Hx of B ankle injuries.  Pt plays soccer league in Fall and Spring. Pt wore a cast for some years in his youth due to leg issues upon birth    Patient Stated Goals make sure everything works like it suppose to              [facility] PT Assessment - 04/12/20 1745      Assessment   Referring Provider (PT) Vaillancourt      Precautions   Precautions None      Restrictions   Weight Bearing Restrictions No      Balance Screen   Has the patient fallen in the past 6 months No      Observation/Other Assessments   Observations sacral sitting    Scoliosis L iliac R shoulder higher,      Strength   Overall Strength  Comments hip abd B 3/5      Bed Mobility   Bed Mobility --   crunch method     Ambulation/Gait   Gait Comments R hothoracic rotation posterior in gait                      Objective measurements completed on examination: See above findings.     Pelvic Floor Special Questions - 04/12/20 1747    Diastasis Recti 74fingers width supra pubic    External Perineal Exam through clothing, dyscoordination of pelvic floor                         PT Long Term Goals - 04/12/20 1735      PT LONG TERM GOAL #1   Title Pt will report no difficulty with initiating urine and post urine dribble across 1 week in order to work and participate in community activities    Time 6    Period Weeks    Status New    Target Date 05/24/20      PT LONG TERM GOAL #2   Title Pt will demo no scrotal / R testicle pain across 2 weeks in  order improve QOL and to bend over without pain and return to playing soccer with less risk for relapse    Time 8    Period Weeks    Status New    Target Date 06/07/20      PT LONG TERM GOAL #3   Title Pt will demo equal pelvic girdle alignment and less spinal deviations across 2 weeks in order to progress to deep core strengthening and pelvic floor training    Time 3    Period Weeks    Status New    Target Date 05/03/20      PT LONG TERM GOAL #4   Title Pt will demo his diastasis recti separation of 3 fingers suprapubic level to < 1 finger in order to improve IAP system to sit upright and not sacral sit    Time 8    Period Weeks    Status New    Target Date 06/07/20      PT LONG TERM GOAL #5   Title Pt will decrease his Pelvic Pain score on FOTO from 54 pts to < 44 pts in order to improve QOL and restore pelvic floor functions    Time 10    Period Weeks    Status New    Target Date 06/21/20      Additional Long Term Goals   Additional Long Term Goals Yes      PT LONG TERM GOAL #6   Title Pt will increase FOTO urinary problems score from 58 pts to > 63 pts in order to urinate without difficulty    Time 10    Period Weeks    Status New    Target Date 06/21/20                  Plan - 04/12/20 1742    Clinical Impression Statement  Pt is a  29  yo  who R sided pubic and testicular pain in addition to difficulty with urination and urinary frequency which impacts his ADLs and QOL.   Pt's musculoskeletal assessment revealed uneven pelvic girdle/ shoulder height, spinal deviations,  Gait deviations, diastasis recti, dyscoordination and strength of pelvic floor mm, weak hip weakness, and poor posture and body mechanics which places  strain on the abdominal/pelvic floor mm.   These are deficits that indicate an ineffective intraabdominal pressure system associated with increased risk for pt's Sx.   Pt was provided education on etiology of Sx with anatomy, physiology  explanation with images along with the benefits of customized pelvic PT Tx based on pt's medical conditions and musculoskeletal deficits.  Explained the physiology of deep core mm coordination and roles of pelvic floor function in urination, defecation, sexual function, and postural control with deep core mm system.   Regional interdependent approaches will yield greater benefits in pt's POC due to the following pertinent Hx: plays in a soccer league in fall and spring and Hx of wearing a cast for some years in his youth due to leg issues upon birth.   Following Tx today which pt demo'd IND with sitting posture and was educated on ergonomics to proper  computer set up at home to minimize slouched posture. Plan to address uneven pelvic / shoulder height and DRA at next session and initiate deep core coordination and strengthening. Pt benefits from skilled PT      Stability/Clinical Decision Making Evolving/Moderate complexity    Clinical Decision Making Moderate    Rehab Potential Good    PT Frequency 1x / week    PT Duration Other (comment)   10   PT Treatment/Interventions ADLs/Self Care Home Management;Cryotherapy;Moist Heat;Functional mobility training;Stair training;Neuromuscular re-education;Patient/family education;Therapeutic activities;Therapeutic exercise;Manual techniques;Taping;Manual lymph drainage;Traction;Scar mobilization;Balance training;Gait training    Consulted and Agree with Plan of Care Patient           Patient will benefit from skilled therapeutic intervention in order to improve the following deficits and impairments:  Decreased coordination,Decreased endurance,Decreased range of motion,Decreased strength,Decreased scar mobility,Decreased mobility,Improper body mechanics,Pain,Increased muscle spasms,Hypomobility,Impaired flexibility,Difficulty walking,Decreased safety awareness,Postural dysfunction,Abnormal gait  Visit Diagnosis: Other lack of coordination  Abnormal  posture  Sacrococcygeal disorders, not elsewhere classified     Problem List Patient Active Problem List   Diagnosis Date Noted  . Internal hemorrhoid 08/26/2019  . History of eosinophilia 12/25/2017    [name] ,PT, DPT, E-RYT  04/12/2020, 5:49 PM  [place] MAIN [facility] SERVICES [address] [place], [place], [address] Phone: [phone]   Fax:  [phone]  Name: Brandon Mcbride MRN: [id number] Date of Birth: 16-Dec-1990

## 2020-04-12 NOTE — Patient Instructions (Addendum)
°  Car seat modification  Folded towels to fill bucket seat Folded beach towel long ways against back of seat into head rest to fill in the space so your head and back and sacrum aligns  Hands at 9 and 3 o clock  Feet and heel on the mat  Back of the hips against the seat evenly    __  Sitting posture, catch clouching,  points of contact, feet and sitting bones   __  Ergonomic handout for work station   ___  Avoid straining pelvic floor, abdominal muscles , spine  Use log rolling technique instead of getting out of bed with your neck or the sit-up     Log rolling into and out of bed   Log rolling into and out of bed If getting out of bed on R side, Bent knees, scoot hips/ shoulder to L  Raise R arm completely overhead, rolling onto armpit  Then lower bent knees to bed to get into complete side lying position  Then drop legs off bed, and push up onto R elbow/forearm, and use L hand to push onto the bed

## 2020-04-19 ENCOUNTER — Ambulatory Visit: Payer: BC Managed Care – PPO | Admitting: Physical Therapy

## 2020-04-25 ENCOUNTER — Encounter: Payer: Self-pay | Admitting: Family Medicine

## 2020-04-25 DIAGNOSIS — D229 Melanocytic nevi, unspecified: Secondary | ICD-10-CM

## 2020-04-26 ENCOUNTER — Telehealth: Payer: Self-pay

## 2020-04-26 NOTE — Telephone Encounter (Signed)
So exciting for them!!! I expect to see lots of pictures next time.  Lots of option for pediatricians in the area.  We only see kids 5 and up. My kid sees Carlene Coria MD at [facility] in [place]. I did residency with her and she is lovely.

## 2020-04-26 NOTE — Telephone Encounter (Signed)
Pt advised.   Thanks,   -Ritamarie Arkin  

## 2020-04-26 NOTE — Telephone Encounter (Signed)
Copied from CRM [id number]. Topic: General - Other >> Apr 25, 2020  1:06 PM Cuthrell, Pearlean Brownie wrote: Reason for [id number] is in the process of adoption and baby is in the process of being born today (04-25-20) , Patient called to ask Dr. Beryle Flock for a pediatrician recommendation. Patient would like for Dr. Beryle Flock to follow back up.

## 2020-04-27 NOTE — Telephone Encounter (Signed)
Order placed

## 2020-04-28 ENCOUNTER — Other Ambulatory Visit: Payer: Self-pay

## 2020-04-28 ENCOUNTER — Ambulatory Visit: Payer: BC Managed Care – PPO | Attending: Physician Assistant | Admitting: Physical Therapy

## 2020-04-28 DIAGNOSIS — M533 Sacrococcygeal disorders, not elsewhere classified: Secondary | ICD-10-CM | POA: Diagnosis not present

## 2020-04-28 DIAGNOSIS — R278 Other lack of coordination: Secondary | ICD-10-CM | POA: Diagnosis not present

## 2020-04-28 DIAGNOSIS — R293 Abnormal posture: Secondary | ICD-10-CM | POA: Insufficient documentation

## 2020-04-28 NOTE — Patient Instructions (Signed)
  Lengthen Back rib by R  shoulder    Lie on L  side , pillow between knees and under head  Pull  arm overhead over mattress, grab the edge of mattress,pull it upward, drawing elbow away from ears  Breathing 10 reps  Open book (handout)  Lying on  _ side , rotating  __ only this week  Rotating onto pillow /yoga block  Pillow/ Block between knees  10 reps  __  Lying on back, knees bent    band under ballmounds  while laying on back w/ knees bent  "W" exercise  10 reps x 3 sets   Band is placed under feet, knees bent, feet are hip width apart Hold band with thumbs point out, keep upper arm and elbow touching the bed the whole time  - inhale and then exhale pull bands by bending elbows hands move in a "w"  (feel shoulder blades squeezing)   __   Work stretches    Hands on kitchen counter/desk    Palms shoulder width apart  Minisquat postion Trunk is parallel to floor  A) Pull buttocks back to lengthen spine, knees bent  3 breaths   B) Bring R hand to the L, and stretch the R side trunk  3 breaths   Brings hands to center again Do the same to the L side stretch by placing L hand on top of R   D) Modified thread the needle R hand on L thigh, L  thigh pushing out slightly as the R hands pull in,  elbow bent and pulls to theR,  Look under L armpit   Do the same to other side  ____  Richardson Dopp stretch for the middle of shoulder blade

## 2020-04-28 NOTE — Therapy (Signed)
[facility] MAIN [facility] SERVICES [address] [place], [place], [address] Phone: [phone]   Fax:  [phone]  Physical Therapy Treatment  Patient Details  Name: Brandon Mcbride MRN: [id number] Date of Birth: 28-Sep-1990 Referring Provider (PT): Vaillancourt   Encounter Date: 04/28/2020   PT End of Session - 04/28/20 1457    Visit Number 2    Number of Visits 10    Date for PT Re-Evaluation 06/21/20    PT Start Time 1400    PT Stop Time 1457    PT Time Calculation (min) 57 min    Activity Tolerance Patient tolerated treatment well    Behavior During Therapy [facility] for tasks assessed/performed           No past medical history on file.  Past Surgical History:  Procedure Laterality Date  . WISDOM TOOTH EXTRACTION      There were no vitals filed for this visit.   Subjective Assessment - 04/28/20 1406    Subjective Pt noticed when he is sitting on his sitting bones helped his Sx. The urgency is almost resolved and the overall soreness is greatly lessened 50-60%. This made his trip more bearable when driving to visit family.    Pertinent History Denied pain with erection./ ejaculation , LBP, Hx of B ankle injuries.  Pt plays soccer league in Fall and Spring. Pt wore a cast for some years in his youth due to leg issues upon birth    Patient Stated Goals make sure everything works like it suppose to              [facility] PT Assessment - 04/28/20 1408      Observation/Other Assessments   Observations chest breathing    Scoliosis R thoracic , L lumbar convex curves, L iliac crest slightly higher ( post Tx: levelled pelvic girdle and shoulders, less convex curves along T and L spine)      Palpation   Palpation comment teres minor,  intercostals T10, paraspinals R , rotational mob Grade II at thoracic segments                         [facility] Adult PT Treatment/Exercise - 04/28/20 1457      Neuro Re-ed     Neuro Re-ed Details  cued for HEP      Exercises   Exercises --   see pt instructions     Manual Therapy   Manual therapy comments STM/MWM teres minor,  intercostals T10, paraspinals R , rotational mob Grade II at thoracic segments                       PT Long Term Goals - 04/12/20 1735      PT LONG TERM GOAL #1   Title Pt will report no difficulty with initiating urine and post urine dribble across 1 week in order to work and participate in community activities    Time 6    Period Weeks    Status New    Target Date 05/24/20      PT LONG TERM GOAL #2   Title Pt will demo no scrotal / R testicle pain across 2 weeks in order improve QOL and to bend over without pain and return to playing soccer with less risk for relapse    Time 8    Period Weeks    Status New    Target Date  06/07/20      PT LONG TERM GOAL #3   Title Pt will demo equal pelvic girdle alignment and less spinal deviations across 2 weeks in order to progress to deep core strengthening and pelvic floor training    Time 3    Period Weeks    Status New    Target Date 05/03/20      PT LONG TERM GOAL #4   Title Pt will demo his diastasis recti separation of 3 fingers suprapubic level to < 1 finger in order to improve IAP system to sit upright and not sacral sit    Time 8    Period Weeks    Status New    Target Date 06/07/20      PT LONG TERM GOAL #5   Title Pt will decrease his Pelvic Pain score on FOTO from 54 pts to < 44 pts in order to improve QOL and restore pelvic floor functions    Time 10    Period Weeks    Status New    Target Date 06/21/20      Additional Long Term Goals   Additional Long Term Goals Yes      PT LONG TERM GOAL #6   Title Pt will increase FOTO urinary problems score from 58 pts to > 63 pts in order to urinate without difficulty    Time 10    Period Weeks    Status New    Target Date 06/21/20                 Plan - 04/28/20 1457    Clinical Impression  Statement Pt has improved pain with compliance to sitting body mechanics. Focused today on correcting scoliosis and pelvic misalignment with manual Tx. Following Tx, pt demo'd equal pelvic / shoulder alignment and less convex curves at thoracic/ lumbar curves. Initiated scapulothoracic strengthening to minimize hunched position. Plan to progress to deep core coordination next session as his more upright posture will yield better outcomes for pelvic floor function.  Pt continues to benefit from skilled PT    Stability/Clinical Decision Making Evolving/Moderate complexity    Rehab Potential Good    PT Frequency 1x / week    PT Duration Other (comment)   10   PT Treatment/Interventions ADLs/Self Care Home Management;Cryotherapy;Moist Heat;Functional mobility training;Stair training;Neuromuscular re-education;Patient/family education;Therapeutic activities;Therapeutic exercise;Manual techniques;Taping;Manual lymph drainage;Traction;Scar mobilization;Balance training;Gait training    Consulted and Agree with Plan of Care Patient           Patient will benefit from skilled therapeutic intervention in order to improve the following deficits and impairments:  Decreased coordination,Decreased endurance,Decreased range of motion,Decreased strength,Decreased scar mobility,Decreased mobility,Improper body mechanics,Pain,Increased muscle spasms,Hypomobility,Impaired flexibility,Difficulty walking,Decreased safety awareness,Postural dysfunction,Abnormal gait  Visit Diagnosis: Other lack of coordination  Abnormal posture  Sacrococcygeal disorders, not elsewhere classified     Problem List Patient Active Problem List   Diagnosis Date Noted  . Internal hemorrhoid 08/26/2019  . History of eosinophilia 12/25/2017    Mariane Masters ,PT, DPT, E-RYT  04/28/2020, 3:08 PM  [place] [facility] MAIN [facility] SERVICES [address] [place], [place], [address] Phone: [phone]    Fax:  [phone]  Name: Brandon Mcbride MRN: [id number] Date of Birth: Jun 12, 1990

## 2020-05-02 DIAGNOSIS — D225 Melanocytic nevi of trunk: Secondary | ICD-10-CM | POA: Diagnosis not present

## 2020-05-02 DIAGNOSIS — D2262 Melanocytic nevi of left upper limb, including shoulder: Secondary | ICD-10-CM | POA: Diagnosis not present

## 2020-05-02 DIAGNOSIS — D2272 Melanocytic nevi of left lower limb, including hip: Secondary | ICD-10-CM | POA: Diagnosis not present

## 2020-05-02 DIAGNOSIS — D2261 Melanocytic nevi of right upper limb, including shoulder: Secondary | ICD-10-CM | POA: Diagnosis not present

## 2020-05-05 ENCOUNTER — Ambulatory Visit: Payer: BC Managed Care – PPO | Admitting: Physical Therapy

## 2020-05-05 ENCOUNTER — Other Ambulatory Visit: Payer: Self-pay

## 2020-05-05 DIAGNOSIS — M533 Sacrococcygeal disorders, not elsewhere classified: Secondary | ICD-10-CM

## 2020-05-05 DIAGNOSIS — R293 Abnormal posture: Secondary | ICD-10-CM | POA: Diagnosis not present

## 2020-05-05 DIAGNOSIS — R278 Other lack of coordination: Secondary | ICD-10-CM | POA: Diagnosis not present

## 2020-05-05 NOTE — Therapy (Signed)
[place] MAIN [facility] SERVICES [address] [place], [place], [address] Phone: [phone]   Fax:  [phone]  Physical Therapy Treatment  Patient Details  Name: Brandon Mcbride MRN: [id number] Date of Birth: [birth year] Referring Provider (PT): Vaillancourt   Encounter Date: 05/05/2020   PT End of Session - 05/05/20 1509    Visit Number 3    Number of Visits 10    Date for PT Re-Evaluation 06/21/20    PT Start Time 1400    PT Stop Time 1500    PT Time Calculation (min) 60 min    Activity Tolerance Patient tolerated treatment well    Behavior During Therapy [facility] for tasks assessed/performed           No past medical history on file.  Past Surgical History:  Procedure Laterality Date  . WISDOM TOOTH EXTRACTION      There were no vitals filed for this visit.   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 The Sx are better compared to last semester. Pt notices he stands with more weight on one leg.    Pertinent History Denied pain with erection./ ejaculation , LBP, Hx of B ankle injuries.  Pt plays soccer league in Fall and Spring. Pt wore a cast for some years in his youth due to leg issues upon birth    Patient Stated Goals make sure everything works like it suppose to              [facility] PT Assessment - 05/05/20 1425      Coordination   Gross Motor Movements are Fluid and Coordinated --   abdominal straining w/ exhalation     Other:   Other/ Comments simulated lecturing stance and voice projection: noted chest breathing and posterior weight on heels in stance      Strength   Overall Strength Comments hip abd B 5/5      Palpation   Spinal mobility T/L convex L convex , levelled pelvic girdle / shoulder levelled    Palpation comment deviation of T5-6 to L, hypomobile                      Pelvic Floor  Special Questions - 05/05/20 1428    Diastasis Recti 1 fingers width apart             [facility] Adult PT Treatment/Exercise - 05/05/20 1501      Therapeutic Activites    Therapeutic Activities --   simulated vocalizing, explained glottis as third postural mm, implications when lecturing     Neuro Re-ed    Neuro Re-ed Details  cued for diaphramgatic breathing in supine and standing and limited ab straining      Manual Therapy   Manual therapy comments STM/MWM at T5-6intercostals L, PA mob at T5-6                       PT Long Term Goals - 04/29/20 1543      PT LONG TERM GOAL #1   Title Pt will report no difficulty with initiating urine and post urine dribble across 1 week in order to work and participate in community activities    Time 6    Period Weeks    Status On-going      PT LONG TERM GOAL #2  Title Pt will demo no scrotal / R testicle pain across 2 weeks in order improve QOL and to bend over without pain and return to playing soccer with less risk for relapse    Time 8    Period Weeks    Status On-going      PT LONG TERM GOAL #3   Title Pt will demo equal pelvic girdle alignment and less spinal deviations across 2 weeks in order to progress to deep core strengthening and pelvic floor training    Time 3    Period Weeks    Status Achieved      PT LONG TERM GOAL #4   Title Pt will demo his diastasis recti separation of 3 fingers suprapubic level to < 1 finger in order to improve IAP system to sit upright and not sacral sit    Time 8    Period Weeks    Status Achieved      PT LONG TERM GOAL #5   Title Pt will decrease his Pelvic Pain score on FOTO from 54 pts to < 44 pts in order to improve QOL and restore pelvic floor functions    Time 10    Period Weeks    Status On-going      PT LONG TERM GOAL #6   Title Pt will increase FOTO urinary problems score from 58 pts to > 63 pts in order to urinate without difficulty    Time 10    Period Weeks    Status  On-going                 Plan - 05/05/20 1509    Clinical Impression Statement Pt showed good carry over with more levelled pelvic girdle with less deviations to spine which will help yield longer lasting outcomes. Continued to further require manual Tx adjust hypomobility at T5-6 , intercostals mm which is related to T/L convex curve. Pt's DRA has improved and pt progressed to deep core coordination and strengthening. Pt required excessive cues for proper diaphragmatic breathing and less abdominal straining. 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  Explained the relationship of glottis as third postural mm and how the three diaphragms impact postural stability and how proper diaphragmatic excursion can help minimize tightening of pelvic floor/ low abdominal area and help decrease his Sx. Pt demo'd improved diaphragmatic excursion and vocalizing practices and demo'd less accessory mm use with vocalize following training. Plan to assess pelvic floor next session. Pt continues to benefit from skilled PT       Stability/Clinical Decision Making Evolving/Moderate complexity    Rehab Potential Good    PT Frequency 1x / week    PT Duration Other (comment)   10   PT Treatment/Interventions ADLs/Self Care Home Management;Cryotherapy;Moist Heat;Functional mobility training;Stair training;Neuromuscular re-education;Patient/family education;Therapeutic activities;Therapeutic exercise;Manual techniques;Taping;Manual lymph drainage;Traction;Scar mobilization;Balance training;Gait training    Consulted and Agree with Plan of Care Patient           Patient will benefit from skilled therapeutic intervention in order to improve the following deficits and impairments:  Decreased coordination,Decreased endurance,Decreased range of motion,Decreased strength,Decreased scar mobility,Decreased mobility,Improper body  mechanics,Pain,Increased muscle spasms,Hypomobility,Impaired flexibility,Difficulty walking,Decreased safety awareness,Postural dysfunction,Abnormal gait  Visit Diagnosis: Other lack of coordination  Abnormal posture  Sacrococcygeal disorders, not elsewhere classified     Problem List Patient Active Problem List   Diagnosis Date Noted  . Internal hemorrhoid 08/26/2019  .  History of eosinophilia 12/25/2017    [name] ,PT, DPT, E-RYT  05/05/2020, 3:45 PM  [place] MAIN [facility] SERVICES [address] [place], [place], [address] Phone: [phone]   Fax:  [phone]  Name: Brandon Mcbride MRN: [id number] Date of Birth: 1991/04/08

## 2020-05-05 NOTE — Patient Instructions (Signed)
Deep core level 1 and 2 ( handout) 

## 2020-05-12 ENCOUNTER — Ambulatory Visit: Payer: BC Managed Care – PPO | Admitting: Physical Therapy

## 2020-05-12 DIAGNOSIS — M533 Sacrococcygeal disorders, not elsewhere classified: Secondary | ICD-10-CM | POA: Diagnosis not present

## 2020-05-12 DIAGNOSIS — R278 Other lack of coordination: Secondary | ICD-10-CM

## 2020-05-12 DIAGNOSIS — R293 Abnormal posture: Secondary | ICD-10-CM

## 2020-05-12 NOTE — Therapy (Addendum)
[place] MAIN [facility] SERVICES [address] [place], [place], [address] Phone: [phone]   Fax:  [phone]  Physical Therapy Treatment  Patient Details  Name: Brandon Mcbride MRN: [id number] Date of Birth: May 13, 1990 Referring Provider (PT): Vaillancourt   Encounter Date: 05/12/2020   PT End of Session - 05/12/20 1412    Visit Number 4    Number of Visits 10    Date for PT Re-Evaluation 06/21/20    PT Start Time 8588    PT Stop Time 1502    PT Time Calculation (min) 59 min    Activity Tolerance Patient tolerated treatment well    Behavior During Therapy [facility] for tasks assessed/performed           No past medical history on file.  Past Surgical History:  Procedure Laterality Date  . WISDOM TOOTH EXTRACTION      There were no vitals filed for this visit.   Subjective Assessment - 05/12/20 1407    Subjective Pt reported he is finally getting the hang of breathing with his diaphragm instead of the pushing in his lower abdominal area.  Pt notices his R testicular pain has resolved. The pubic pain comes in waves and there are short periods of time when it is better at 85-95% improvement and then followed by a bunch of cramps.  The cramps come every now and then and depends on what he has done that day ( i.e typing, making slides and when he gets really focused and notices his body tenses). After a long period of being focused with tensed body, it will be particularly bad. With the urgency , it is 80-90% better and not as strong. Pt felt for a brief period for the first time since last Sept ( 5 months) when he experienced the pubic area feeling normal and he cried out of relief.    Pertinent History Denied pain with erection./ ejaculation , LBP, Hx of B ankle injuries.  Pt plays soccer league in Fall and Spring. Pt wore a cast for some years in his youth due to leg issues upon birth    Patient Stated Goals make sure  everything works like it suppose to              [facility] PT Assessment - 05/12/20 1455      Palpation   Palpation comment fascial retractions at L LQ , tightenss at intercostals T5-10, interspinals L                      Pelvic Floor Special Questions - 05/12/20 1456    External Perineal Exam through sheet and undergarment: notied tightness at suprapubic R , R pubic symphysis mm attachments             [facility] Adult PT Treatment/Exercise - 05/12/20 1457      Exercises   Exercises --   RLE passive stretching off bed, childs pose rocking      Modalities   Modalities --   5     Moist Heat Therapy   Number Minutes Moist Heat 5 Minutes    Moist Heat Location --   pubic, in prone, pillows under shin for passive stretching     Manual Therapy   Manual therapy comments STM/MWM at areas noted in assessment at L LQ abdomen, intercostals/ interspinals, R suprapubic/pubic symphysis mm attachments  PT Long Term Goals - 04/29/20 1543      PT LONG TERM GOAL #1   Title Pt will report no difficulty with initiating urine and post urine dribble across 1 week in order to work and participate in community activities    Time 6    Period Weeks    Status On-going      PT LONG TERM GOAL #2   Title Pt will demo no scrotal / R testicle pain across 2 weeks in order improve QOL and to bend over without pain and return to playing soccer with less risk for relapse    Time 8    Period Weeks    Status On-going      PT LONG TERM GOAL #3   Title Pt will demo equal pelvic girdle alignment and less spinal deviations across 2 weeks in order to progress to deep core strengthening and pelvic floor training    Time 3    Period Weeks    Status Achieved      PT LONG TERM GOAL #4   Title Pt will demo his diastasis recti separation of 3 fingers suprapubic level to < 1 finger in order to improve IAP system to sit upright and not sacral sit    Time 8    Period  Weeks    Status Achieved      PT LONG TERM GOAL #5   Title Pt will decrease his Pelvic Pain score on FOTO from 54 pts to < 44 pts in order to improve QOL and restore pelvic floor functions    Time 10    Period Weeks    Status On-going      PT LONG TERM GOAL #6   Title Pt will increase FOTO urinary problems score from 58 pts to > 63 pts in order to urinate without difficulty    Time 10    Period Weeks    Status On-going                 Plan - 05/12/20 1459    Clinical Impression Statement Pt is making excellent progress with resolved R testicular pain. The pubic pain relief in short periods of time when it is better at 85-95% improvement. With the urgency , it is 80-90% better and not as strong.   Pt's assessment today showed remaining tightness at L thoracic areas, fascial restrictions at L LQ and signfiicant tightness at R suprapubic/ pubic symphysis.  Pt reported less tightness at R pubic area and improved ability to breathe and expand L ribcage area post manual Tx which he tolerated without complaints.   Anticipate pt will continue to make further progress. Pt continues to benefit from skilled PT.     Stability/Clinical Decision Making Evolving/Moderate complexity    Rehab Potential Good    PT Frequency 1x / week    PT Duration Other (comment)   10   PT Treatment/Interventions ADLs/Self Care Home Management;Cryotherapy;Moist Heat;Functional mobility training;Stair training;Neuromuscular re-education;Patient/family education;Therapeutic activities;Therapeutic exercise;Manual techniques;Taping;Manual lymph drainage;Traction;Scar mobilization;Balance training;Gait training    Consulted and Agree with Plan of Care Patient           Patient will benefit from skilled therapeutic intervention in order to improve the following deficits and impairments:  Decreased coordination,Decreased endurance,Decreased range of motion,Decreased strength,Decreased scar mobility,Decreased  mobility,Improper body mechanics,Pain,Increased muscle spasms,Hypomobility,Impaired flexibility,Difficulty walking,Decreased safety awareness,Postural dysfunction,Abnormal gait  Visit Diagnosis: Other lack of coordination  Abnormal posture  Sacrococcygeal disorders, not elsewhere classified  Problem List Patient Active Problem List   Diagnosis Date Noted  . Internal hemorrhoid 08/26/2019  . History of eosinophilia 12/25/2017    [name] ,PT, DPT, E-RYT  05/12/2020, 3:05PM  [place] MAIN [facility] SERVICES [address] [place], [place], [address] Phone: [phone]   Fax:  [phone]  Name: Brandon Mcbride MRN: [id number] Date of Birth: 1991-03-31

## 2020-05-12 NOTE — Patient Instructions (Addendum)
Keep up with exercises  Add childs poses rocking with pillow under belly if need more support/ relaxation  childs poses rocking   Toes tucked, shoulders down and back, on forearms , hands shoulder width apart  10 reps

## 2020-05-19 ENCOUNTER — Ambulatory Visit: Payer: BC Managed Care – PPO | Admitting: Physical Therapy

## 2020-05-26 ENCOUNTER — Other Ambulatory Visit: Payer: Self-pay

## 2020-05-26 ENCOUNTER — Ambulatory Visit: Payer: BC Managed Care – PPO | Attending: Physician Assistant | Admitting: Physical Therapy

## 2020-05-26 DIAGNOSIS — R278 Other lack of coordination: Secondary | ICD-10-CM | POA: Diagnosis not present

## 2020-05-26 DIAGNOSIS — M533 Sacrococcygeal disorders, not elsewhere classified: Secondary | ICD-10-CM | POA: Insufficient documentation

## 2020-05-26 DIAGNOSIS — R293 Abnormal posture: Secondary | ICD-10-CM | POA: Insufficient documentation

## 2020-05-26 NOTE — Patient Instructions (Addendum)
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

## 2020-05-26 NOTE — Therapy (Signed)
[place] MAIN [facility] SERVICES [address] [place], [place], [address] Phone: [phone]   Fax:  [phone]  Physical Therapy Treatment  Patient Details  Name: Brandon Mcbride MRN: [id number] Date of Birth: April 11, 1991 Referring Provider (PT): Vaillancourt   Encounter Date: 05/26/2020   PT End of Session - 05/26/20 1410    Visit Number 5    Number of Visits 10    Date for PT Re-Evaluation 06/21/20    PT Start Time 5809    PT Stop Time 1500    PT Time Calculation (min) 53 min    Activity Tolerance Patient tolerated treatment well    Behavior During Therapy [facility] for tasks assessed/performed           No past medical history on file.  Past Surgical History:  Procedure Laterality Date  . WISDOM TOOTH EXTRACTION      There were no vitals filed for this visit.   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 Pt notices when he is more focused, he is more tense. Pt started doing the childs pose after last session and it has helped the pelvic pain twice as much the following day.  [place] is no longer accompanying the soreness when it occurs with the soreness    Pertinent History Denied pain with erection./ ejaculation , LBP, Hx of B ankle injuries.  Pt plays soccer league in Fall and Spring. Pt wore a cast for some years in his youth due to leg issues upon birth    Patient Stated Goals make sure everything works like it suppose to                          Pelvic Floor Special Questions - 05/26/20 1500    External Perineal Exam through sheet and undergarment: notied tightness at ischiocavernosus, bulbospongiosus, deep tranverse perineal L > R , urethrae compressae L             [facility] Adult PT Treatment/Exercise - 05/26/20 1501       Therapeutic Activites    Therapeutic Activities --   quick relaxation for work breaks     Neuro Re-ed    Neuro Re-ed Details  cued for contraction and less ab overuse      Manual Therapy   Manual therapy comments STM/MWM at problem areas to promote more pelvic floor lengthening                       PT Long Term Goals - 04/29/20 1543      PT LONG TERM GOAL #1   Title Pt will report no difficulty with initiating urine and post urine dribble across 1 week in order to work and participate in community activities    Time 6    Period Weeks    Status On-going      PT LONG TERM GOAL #2   Title Pt will demo no scrotal / R testicle pain across 2 weeks in order improve QOL and to bend over without pain and return to playing soccer with less risk for relapse    Time 8    Period Weeks    Status On-going      PT LONG TERM GOAL #3  Title Pt will demo equal pelvic girdle alignment and less spinal deviations across 2 weeks in order to progress to deep core strengthening and pelvic floor training    Time 3    Period Weeks    Status Achieved      PT LONG TERM GOAL #4   Title Pt will demo his diastasis recti separation of 3 fingers suprapubic level to < 1 finger in order to improve IAP system to sit upright and not sacral sit    Time 8    Period Weeks    Status Achieved      PT LONG TERM GOAL #5   Title Pt will decrease his Pelvic Pain score on FOTO from 54 pts to < 44 pts in order to improve QOL and restore pelvic floor functions    Time 10    Period Weeks    Status On-going      PT LONG TERM GOAL #6   Title Pt will increase FOTO urinary problems score from 58 pts to > 63 pts in order to urinate without difficulty    Time 10    Period Weeks    Status On-going                 Plan - 05/26/20 1410    Clinical Impression Statement Pt is improving as he reports that soreness at pelvic area is not longer accompanied with urgency which is a positive  improvement,  Continued to apply external manual Tx to minimize anterior pelvic mm tightness. Pt demo'd dyscoordination with overuse of ab when cued for contraction. Pt demo'd improvement after training. Plan to add thoracolumbar strengthening at next session. Pt continues to benefit from skilled PT.    Stability/Clinical Decision Making Evolving/Moderate complexity    Rehab Potential Good    PT Frequency 1x / week    PT Duration Other (comment)   10   PT Treatment/Interventions ADLs/Self Care Home Management;Cryotherapy;Moist Heat;Functional mobility training;Stair training;Neuromuscular re-education;Patient/family education;Therapeutic activities;Therapeutic exercise;Manual techniques;Taping;Manual lymph drainage;Traction;Scar mobilization;Balance training;Gait training    Consulted and Agree with Plan of Care Patient           Patient will benefit from skilled therapeutic intervention in order to improve the following deficits and impairments:  Decreased coordination,Decreased endurance,Decreased range of motion,Decreased strength,Decreased scar mobility,Decreased mobility,Improper body mechanics,Pain,Increased muscle spasms,Hypomobility,Impaired flexibility,Difficulty walking,Decreased safety awareness,Postural dysfunction,Abnormal gait  Visit Diagnosis: Sacrococcygeal disorders, not elsewhere classified     Problem List Patient Active Problem List   Diagnosis Date Noted  . Internal hemorrhoid 08/26/2019  . History of eosinophilia 12/25/2017    [name] ,PT, DPT, E-RYT  05/26/2020, 3:04 PM  [place] MAIN [facility] SERVICES [address] [place], [place], [address] Phone: [phone]   Fax:  [phone]  Name: Brandon Mcbride MRN: [id number] Date of Birth: 06-15-1990

## 2020-06-02 ENCOUNTER — Ambulatory Visit: Payer: BC Managed Care – PPO | Admitting: Physical Therapy

## 2020-06-02 ENCOUNTER — Other Ambulatory Visit: Payer: Self-pay

## 2020-06-02 DIAGNOSIS — R278 Other lack of coordination: Secondary | ICD-10-CM | POA: Diagnosis not present

## 2020-06-02 DIAGNOSIS — R293 Abnormal posture: Secondary | ICD-10-CM

## 2020-06-02 DIAGNOSIS — M533 Sacrococcygeal disorders, not elsewhere classified: Secondary | ICD-10-CM

## 2020-06-02 NOTE — Therapy (Signed)
[place] MAIN [facility] SERVICES [address] [place], [place], [address] Phone: [phone]   Fax:  [phone]  Physical Therapy Treatment  Patient Details  Name: Brandon Mcbride MRN: [id number] Date of Birth: [birth year] Referring Provider (PT): Vaillancourt   Encounter Date: 06/02/2020   PT End of Session - 06/02/20 1408    Visit Number 6    Number of Visits 10    Date for PT Re-Evaluation 06/21/20    PT Start Time 2774    PT Stop Time 1456    PT Time Calculation (min) 54 min    Activity Tolerance Patient tolerated treatment well    Behavior During Therapy [facility] for tasks assessed/performed           No past medical history on file.  Past Surgical History:  Procedure Laterality Date  . WISDOM TOOTH EXTRACTION      There were no vitals filed for this visit.   Subjective Assessment - 06/02/20 1407    Subjective Pt reports his pelvic pain and urgency have been a lot better by 90%. Pt plans to play soccer in March and run a 10 mile running race in mid April    Pertinent History Denied pain with erection./ ejaculation , LBP, Hx of B ankle injuries.  Pt plays soccer league in Fall and Spring. Pt wore a cast for some years in his youth due to leg issues upon birth    Patient Stated Goals make sure everything works like it suppose to              [facility] PT Assessment - 06/02/20 1419      Coordination   Coordination and Movement Description pertubation no present with posterior sling MMT      Strength   Overall Strength Comments hip abd 4-/5 B, hip ext 5/5, hip flexion R/knee flexion/ext , DF/EV 4-/5, L 5/5      Palpation   Palpation comment tightness at instrinsic feet mm R > L.  hypomobile midfoot R , limited in R DF/EV      Ambulation/Gait   Gait Comments supination, decreased hip flexion,                         [facility] Adult PT Treatment/Exercise - 06/02/20 1419      Neuro Re-ed    Neuro  Re-ed Details  cued for DF/EV strengtehning, transverse arch co-activation with COM anterior, eccenric control with lowering ankle to prevent supination/ inversion      Manual Therapy   Manual therapy comments STM/MWM / PA/ AP mob at feet to promote DF/EV and mobility at midfoot    Kinesiotex --   DF/EV on R foot                      PT Long Term Goals - 06/02/20 1410      PT LONG TERM GOAL #1   Title Pt will report no difficulty with initiating urine and post urine dribble across 1 week in order to work and participate in community activities (06/02/20: initiating urine is not an issue. Post urine dribble is alot less)    Time 6    Period Weeks    Status Partially Met      PT LONG TERM GOAL #2   Title Pt will demo no scrotal / R testicle pain across 2 weeks in order improve QOL and to bend over without pain  and return to playing soccer with less risk for relapse    Time 8    Period Weeks    Status Achieved      PT LONG TERM GOAL #3   Title Pt will demo equal pelvic girdle alignment and less spinal deviations across 2 weeks in order to progress to deep core strengthening and pelvic floor training    Time 3    Period Weeks    Status Achieved      PT LONG TERM GOAL #4   Title Pt will demo his diastasis recti separation of 3 fingers suprapubic level to < 1 finger in order to improve IAP system to sit upright and not sacral sit    Time 8    Period Weeks    Status Achieved      PT LONG TERM GOAL #5   Title Pt will decrease his Pelvic Pain score on FOTO from 54 pts to < 44 pts in order to improve QOL and restore pelvic floor functions    Time 10    Period Weeks    Status On-going      Additional Long Term Goals   Additional Long Term Goals Yes      PT LONG TERM GOAL #6   Title Pt will increase FOTO urinary problems score from 58 pts to > 63 pts in order to urinate without difficulty    Time 10    Period Weeks    Status On-going      PT LONG TERM GOAL #7   Title  Pt will demo proper alignment with fitness routine, mechanics with running, and soccer techniques and be IND with complimentary strengthening/ stretching in order to minimize relapse of pelvic pain when retunring to sports    Time 10    Period Weeks    Status New    Target Date 08/11/20                 Plan - 06/02/20 1456    Clinical Impression Statement Pt is progressing well with significantly decreased pelvic pain, urgency, and has less difficulty with initiating urination. Pt continues to demonstrate upright posture and is managing with work tasks with less pelvic tensions. Today, addressed lower kinetic chain as pt has plans to return to soccer and run 10 mi race in March and April. Pt showed R sided weakness along LE from ankle to hip. Manual Tx and excessive cues promoted more DF/EV and less supination / inversion. These improvements will help minimize ankle injuries which pt has had in the past. Plan to assess running mechanics at next session. Pt continues to benefit from skilled PT     Stability/Clinical Decision Making Evolving/Moderate complexity    Rehab Potential Good    PT Frequency 1x / week    PT Duration Other (comment)   10   PT Treatment/Interventions ADLs/Self Care Home Management;Cryotherapy;Moist Heat;Functional mobility training;Stair training;Neuromuscular re-education;Patient/family education;Therapeutic activities;Therapeutic exercise;Manual techniques;Taping;Manual lymph drainage;Traction;Scar mobilization;Balance training;Gait training    Consulted and Agree with Plan of Care Patient           Patient will benefit from skilled therapeutic intervention in order to improve the following deficits and impairments:  Decreased coordination,Decreased endurance,Decreased range of motion,Decreased strength,Decreased scar mobility,Decreased mobility,Improper body mechanics,Pain,Increased muscle spasms,Hypomobility,Impaired flexibility,Difficulty walking,Decreased  safety awareness,Postural dysfunction,Abnormal gait  Visit Diagnosis: Sacrococcygeal disorders, not elsewhere classified  Other lack of coordination  Abnormal posture     Problem List Patient Active Problem List   Diagnosis Date  Noted  . Internal hemorrhoid 08/26/2019  . History of eosinophilia 12/25/2017    [name] 06/02/2020, 3:00 PM  [place] MAIN [facility] SERVICES [address] [place], [place], [address] Phone: [phone]   Fax:  [phone]  Name: Brandon Mcbride MRN: [id number] Date of Birth: Mar 02, 1991

## 2020-06-02 NOTE — Patient Instructions (Addendum)
  _________  Feet slides :   Points of contact at sitting bones  Four points of contact of foot, Heel up, ankle not twist out Lower heel Four points of contact of foot, Slide foot back   Repeated with other foot 10 reps pair    ___  Ankle strengthening on l with band band wrapped around outer R side of foot ballmound of R foot pressing onto band , L foot is placed on top of band hip width apart, with the ballmound ,  L hand holds the band 15  reps swinging R pinky toe out to the R  X 2x day  ____    Duanne Guess WITH RESISTANCE BLUE Band at waist connected to doorknob  Stepping forward normal length steps, planting mid and forefoot down, center of mass ( navel) leans forward slightly as if you were walking uphill 3-4 steps till band feels taut ( MAKE SURE THE DOOR IS LOCKED AND WON'T OPEN)   Stepping backwards, lower heel slowly, carry trunk and hips back , leaning forward, front knee along 2-3 rd toe line    3-6 min  ____   Higher knees with walking to land on ballmounds, stronger push off

## 2020-06-10 ENCOUNTER — Other Ambulatory Visit: Payer: Self-pay

## 2020-06-10 ENCOUNTER — Ambulatory Visit: Payer: BC Managed Care – PPO | Admitting: Physical Therapy

## 2020-06-10 DIAGNOSIS — R278 Other lack of coordination: Secondary | ICD-10-CM | POA: Diagnosis not present

## 2020-06-10 DIAGNOSIS — R293 Abnormal posture: Secondary | ICD-10-CM

## 2020-06-10 DIAGNOSIS — M533 Sacrococcygeal disorders, not elsewhere classified: Secondary | ICD-10-CM

## 2020-06-10 NOTE — Therapy (Signed)
[place] MAIN [facility] SERVICES [address] [place], [place], [address] Phone: [phone]   Fax:  [phone]  Physical Therapy Treatment  Patient Details  Name: Brandon Mcbride MRN: [id number] Date of Birth: March 07, 1991 Referring Provider (PT): Vaillancourt   Encounter Date: 06/10/2020   PT End of Session - 06/10/20 1004    Visit Number 7    Number of Visits 10    Date for PT Re-Evaluation 06/21/20    PT Start Time 2778    PT Stop Time 1100    PT Time Calculation (min) 58 min    Activity Tolerance Patient tolerated treatment well    Behavior During Therapy [facility] for tasks assessed/performed           No past medical history on file.  Past Surgical History:  Procedure Laterality Date  . WISDOM TOOTH EXTRACTION      There were no vitals filed for this visit.   Subjective Assessment - 06/10/20 1004    Subjective Pt is more cognizant with how tense he is when he is working.  Pt notices it is harder to walk with R foot appropriately.    Pertinent History Denied pain with erection./ ejaculation , LBP, Hx of B ankle injuries.  Pt plays soccer league in Fall and Spring. Pt wore a cast for some years in his youth due to leg issues upon birth    Patient Stated Goals make sure everything works like it suppose to              [facility] PT Assessment - 06/10/20 1030      Palpation   Palpation comment hypomobile R cuboid/ calcaneus, tuberosity of 5th, abductor hallucis      Ambulation/Gait   Gait Comments running : scissoring gait, narrow BOS, IR of knees                         [facility] Adult PT Treatment/Exercise - 06/10/20 1033      Therapeutic Activites    Therapeutic Activities --   running treadmill gait analysis     Neuro Re-ed    Neuro Re-ed Details  cued for new HEP for hip and lower kinetic chain strengthening on RLE and  for feet p ropioception in running gait and in new HEP      Manual  Therapy   Manual therapy comments AP/PA mob, STM/MWM to address hypomobile R cuboid/ calcaneus, tuberosity of 5th, abductor hallucis    Kinesiotex Facilitate Muscle   promted DF/EV on R                      PT Long Term Goals - 06/02/20 1410      PT LONG TERM GOAL #1   Title Pt will report no difficulty with initiating urine and post urine dribble across 1 week in order to work and participate in community activities (06/02/20: initiating urine is not an issue. Post urine dribble is alot less)    Time 6    Period Weeks    Status Partially Met      PT LONG TERM GOAL #2   Title Pt will demo no scrotal / R testicle pain across 2 weeks in order improve QOL and to bend over without pain and return to playing soccer with less risk for relapse    Time 8    Period Weeks    Status Achieved  PT LONG TERM GOAL #3   Title Pt will demo equal pelvic girdle alignment and less spinal deviations across 2 weeks in order to progress to deep core strengthening and pelvic floor training    Time 3    Period Weeks    Status Achieved      PT LONG TERM GOAL #4   Title Pt will demo his diastasis recti separation of 3 fingers suprapubic level to < 1 finger in order to improve IAP system to sit upright and not sacral sit    Time 8    Period Weeks    Status Achieved      PT LONG TERM GOAL #5   Title Pt will decrease his Pelvic Pain score on FOTO from 54 pts to < 44 pts in order to improve QOL and restore pelvic floor functions ( 06/02/20: 0 pts)    Time 10    Period Weeks    Status Achieved      Additional Long Term Goals   Additional Long Term Goals Yes      PT LONG TERM GOAL #6   Title Pt will increase FOTO urinary problems score from 58 pts to > 63 pts in order to urinate without difficulty ( 06/02/20: 75 pts)    Time 10    Period Weeks    Status Achieved      PT LONG TERM GOAL #7   Title Pt will demo proper alignment with fitness routine, mechanics with running, and soccer  techniques and be IND with complimentary strengthening/ stretching in order to minimize relapse of pelvic pain when retunring to sports    Time 10    Period Weeks    Status New    Target Date 08/11/20                 Plan - 06/10/20 1051    Clinical Impression Statement Pt required further manual Tx, new HEP , and propioception training for R lower kinetic chain to promote less overactivity of pelvic floor/ adductor with running. Pt demo'd improved DF/EV mobility post Tx. PT required cues for alignment and activation of R foot and cued for techniques for strengthening hip abduction on R which was weaker than L. Pt continues to benefit from skilled PT to return to running with less risk for injuries and relapse of pelvic Sx   Stability/Clinical Decision Making Evolving/Moderate complexity    Rehab Potential Good    PT Frequency 1x / week    PT Duration Other (comment)   10   PT Treatment/Interventions ADLs/Self Care Home Management;Cryotherapy;Moist Heat;Functional mobility training;Stair training;Neuromuscular re-education;Patient/family education;Therapeutic activities;Therapeutic exercise;Manual techniques;Taping;Manual lymph drainage;Traction;Scar mobilization;Balance training;Gait training    Consulted and Agree with Plan of Care Patient           Patient will benefit from skilled therapeutic intervention in order to improve the following deficits and impairments:  Decreased coordination,Decreased endurance,Decreased range of motion,Decreased strength,Decreased scar mobility,Decreased mobility,Improper body mechanics,Pain,Increased muscle spasms,Hypomobility,Impaired flexibility,Difficulty walking,Decreased safety awareness,Postural dysfunction,Abnormal gait  Visit Diagnosis: Sacrococcygeal disorders, not elsewhere classified  Other lack of coordination  Abnormal posture     Problem List Patient Active Problem List   Diagnosis Date Noted  . Internal hemorrhoid  08/26/2019  . History of eosinophilia 12/25/2017    [name] ,PT, DPT, E-RYT  06/10/2020, 11:47 AM  [place] MAIN [facility] SERVICES [address] [place], [place], [address] Phone: [phone]   Fax:  [phone]  Name: Brandon Daudelin Schoonover  Mcbride MRN: [id number] Date of Birth: 09/11/1990

## 2020-06-10 NOTE — Patient Instructions (Addendum)
  Clam Shell 45 Degrees  Lying with hips and knees bent 45, one pillow between knees and ankles. Heel together, toes apart like ballerina,  Lift knee with exhale while pressing heels together. Be sure pelvis does not roll backward. Do not arch back. Do 20 times, each leg, 2 times per day.     Complimentary stretch: [employer] _ foot over _ thigh, opposite knee straight  3 breaths  * Keep pelvis levelled with tactile cue with hand under back of hips  * Slide the ankle of the supporting foot out to decrease the angle which can help level the pelvis   Cross thigh over thigh, exhale to hug the thighs in with arms pulling back of thigh, shoulders/ head is relaxed down , Use towel behind thigh is needed.  10 reps  ___     Letter T, [place] on one leg with band band under L foot, wrap by big toe then, outer knee/ thigh, L hand pulling ( elbow by side)  Plant ballmound, toes spread, thigh out against band,, tracking knee in line with 2-3rd toe line Dipping forward, R foot lifts slight ( whole body like a see saw) off floor or  Press back foot against wall 20 reps  R only

## 2020-06-17 ENCOUNTER — Ambulatory Visit: Payer: BC Managed Care – PPO | Admitting: Physical Therapy

## 2020-06-17 ENCOUNTER — Other Ambulatory Visit: Payer: Self-pay

## 2020-06-17 DIAGNOSIS — R293 Abnormal posture: Secondary | ICD-10-CM

## 2020-06-17 DIAGNOSIS — R278 Other lack of coordination: Secondary | ICD-10-CM | POA: Diagnosis not present

## 2020-06-17 DIAGNOSIS — M533 Sacrococcygeal disorders, not elsewhere classified: Secondary | ICD-10-CM | POA: Diagnosis not present

## 2020-06-17 NOTE — Therapy (Addendum)
[place] MAIN [facility] SERVICES [address] [place], [place], [address] Phone: [phone]   Fax:  [phone]  Physical Therapy Treatment  Patient Details  Name: Brandon Mcbride MRN: [id number] Date of Birth: 06-15-1990 Referring Provider (PT): Vaillancourt   Encounter Date: 06/17/2020   PT End of Session - 06/17/20 1558    Visit Number 8    Number of Visits 10    Date for PT Re-Evaluation 06/21/20    PT Start Time 1004    PT Stop Time 1105    PT Time Calculation (min) 61 min    Activity Tolerance Patient tolerated treatment well    Behavior During Therapy [facility] for tasks assessed/performed           No past medical history on file.  Past Surgical History:  Procedure Laterality Date  . WISDOM TOOTH EXTRACTION      There were no vitals filed for this visit.   Subjective Assessment - 06/17/20 1559    Subjective Pt did not get to much of his HEP this past week. Pt feels his R ankle being less tight as before    Pertinent History Denied pain with erection./ ejaculation , LBP, Hx of B ankle injuries.  Pt plays soccer league in Fall and Spring. Pt wore a cast for some years in his youth due to leg issues upon birth    Patient Stated Goals make sure everything works like it suppose to              [facility] PT Assessment - 06/17/20 1559      Palpation   Palpation comment tightness at ext digit longus, tib ant, abductor minimis, flexor digitorium, hypmobile talus, navicular, lateral cuneiform, medial cuneiform R                         [facility] Adult PT Treatment/Exercise - 06/17/20 1602      Moist Heat Therapy   Number Minutes Moist Heat 10 Minutes    Moist Heat Location --   foot     Manual Therapy   Manual therapy comments AP/PAmob, lateral glides, STM/MWM at problem areas noted in assessment                       PT Long Term Goals - 06/02/20 1410      PT LONG TERM GOAL #1    Title Pt will report no difficulty with initiating urine and post urine dribble across 1 week in order to work and participate in community activities (06/02/20: initiating urine is not an issue. Post urine dribble is alot less)    Time 6    Period Weeks    Status Partially Met      PT LONG TERM GOAL #2   Title Pt will demo no scrotal / R testicle pain across 2 weeks in order improve QOL and to bend over without pain and return to playing soccer with less risk for relapse    Time 8    Period Weeks    Status Achieved      PT LONG TERM GOAL #3   Title Pt will demo equal pelvic girdle alignment and less spinal deviations across 2 weeks in order to progress to deep core strengthening and pelvic floor training    Time 3    Period Weeks    Status Achieved      PT LONG TERM GOAL #4  Title Pt will demo his diastasis recti separation of 3 fingers suprapubic level to < 1 finger in order to improve IAP system to sit upright and not sacral sit    Time 8    Period Weeks    Status Achieved      PT LONG TERM GOAL #5   Title Pt will decrease his Pelvic Pain score on FOTO from 54 pts to < 44 pts in order to improve QOL and restore pelvic floor functions ( 06/02/20: 0 pts)    Time 10    Period Weeks    Status Achieved      Additional Long Term Goals   Additional Long Term Goals Yes      PT LONG TERM GOAL #6   Title Pt will increase FOTO urinary problems score from 58 pts to > 63 pts in order to urinate without difficulty ( 06/02/20: 75 pts)    Time 10    Period Weeks    Status Achieved      PT LONG TERM GOAL #7   Title Pt will demo proper alignment with fitness routine, mechanics with running, and soccer techniques and be IND with complimentary strengthening/ stretching in order to minimize relapse of pelvic pain when retunring to sports    Time 10    Period Weeks    Status New    Target Date 08/11/20                 Plan - 06/17/20 1604    Clinical Impression Statement Pt required  further manual Tx to continue minimizing supination of R ankle which has Hx of ankle sprains and presents with high arch, stiff midfoot, limited DF/EV, toe abduction. Pt demo'd increased toe abduction, lowered arch, and more WBing on big toe with less report of pain. Regional interdependence approach is helpful for pt as pt had originally presented with R pelvic floor tightness and pelvic floor overactivity relapse can be minimized by addressing his R lower kinetic chain deficits.   Pt will have greater outcomes as he returns to running a 10 mi race and playing soccer in upcoming months. Pt continues to benefit from skilled PT   Stability/Clinical Decision Making Evolving/Moderate complexity    Rehab Potential Good    PT Frequency 1x / week    PT Duration Other (comment)   10   PT Treatment/Interventions ADLs/Self Care Home Management;Cryotherapy;Moist Heat;Functional mobility training;Stair training;Neuromuscular re-education;Patient/family education;Therapeutic activities;Therapeutic exercise;Manual techniques;Taping;Manual lymph drainage;Traction;Scar mobilization;Balance training;Gait training    Consulted and Agree with Plan of Care Patient           Patient will benefit from skilled therapeutic intervention in order to improve the following deficits and impairments:  Decreased coordination,Decreased endurance,Decreased range of motion,Decreased strength,Decreased scar mobility,Decreased mobility,Improper body mechanics,Pain,Increased muscle spasms,Hypomobility,Impaired flexibility,Difficulty walking,Decreased safety awareness,Postural dysfunction,Abnormal gait  Visit Diagnosis: Sacrococcygeal disorders, not elsewhere classified  Other lack of coordination  Abnormal posture     Problem List Patient Active Problem List   Diagnosis Date Noted  . Internal hemorrhoid 08/26/2019  . History of eosinophilia 12/25/2017    [name] ,PT, DPT, E-RYT  06/17/2020, 4:05 PM  [place] MAIN [facility] SERVICES [address] [place], [place], [address] Phone: [phone]   Fax:  [phone]  Name: Brandon Mcbride MRN: [id number] Date of Birth: 1990/10/25

## 2020-06-23 ENCOUNTER — Ambulatory Visit: Payer: BC Managed Care – PPO | Admitting: Physical Therapy

## 2020-06-27 ENCOUNTER — Ambulatory Visit: Payer: BC Managed Care – PPO | Attending: Physician Assistant | Admitting: Physical Therapy

## 2020-06-27 ENCOUNTER — Other Ambulatory Visit: Payer: Self-pay

## 2020-06-27 DIAGNOSIS — M533 Sacrococcygeal disorders, not elsewhere classified: Secondary | ICD-10-CM | POA: Diagnosis not present

## 2020-06-27 DIAGNOSIS — R278 Other lack of coordination: Secondary | ICD-10-CM | POA: Diagnosis not present

## 2020-06-27 DIAGNOSIS — R293 Abnormal posture: Secondary | ICD-10-CM | POA: Diagnosis not present

## 2020-06-27 NOTE — Therapy (Signed)
[place] MAIN [facility] SERVICES [address] [place], [place], [address] Phone: [phone]   Fax:  [phone]  Physical Therapy Treatment  Patient Details  Name: Brandon Mcbride MRN: [id number] Date of Birth: December 03, 1990 Referring Provider (PT): Vaillancourt   Encounter Date: 06/27/2020   PT End of Session - 06/27/20 0856    Visit Number 9    Number of Visits 10    Date for PT Re-Evaluation 09/05/20    PT Start Time 1219    PT Stop Time 1000    PT Time Calculation (min) 65 min    Activity Tolerance Patient tolerated treatment well    Behavior During Therapy [facility] for tasks assessed/performed           No past medical history on file.  Past Surgical History:  Procedure Laterality Date  . WISDOM TOOTH EXTRACTION      There were no vitals filed for this visit.   Subjective Assessment - 06/27/20 0857    Subjective Pt reported the ballmound of big toe on R still hurts when walking on it alot . Urinary and pelvic pain Sx are all good              [facility] PT Assessment - 06/27/20 0955      Palpation   Palpation comment tightness at interreous between ray I-II, II-III, III-IV , abductor minimis, flexor digitorium, hypmobile talus, navicular, lateral cuneiform, medial cuneiform R                         [facility] Adult PT Treatment/Exercise - 06/27/20 0955      Neuro Re-ed    Neuro Re-ed Details  cued for ankle eversion R strengthening      Manual Therapy   Manual therapy comments AP/PAmob, lateral glides, STM/MWM at problem areas noted in assessment                       PT Long Term Goals - 06/27/20 0956      PT LONG TERM GOAL #1   Title Pt will report no difficulty with initiating urine and post urine dribble across 1 week in order to work and participate in community activities (06/02/20: initiating urine is not an issue. Post urine dribble is alot less, 06/27/20: resolved) )    Time 6     Period Weeks    Status Achieved      PT LONG TERM GOAL #2   Title Pt will demo no scrotal / R testicle pain across 2 weeks in order improve QOL and to bend over without pain and return to playing soccer with less risk for relapse    Time 8    Period Weeks    Status Achieved      PT LONG TERM GOAL #3   Title Pt will demo equal pelvic girdle alignment and less spinal deviations across 2 weeks in order to progress to deep core strengthening and pelvic floor training    Time 3    Period Weeks    Status Achieved      PT LONG TERM GOAL #4   Title Pt will demo his diastasis recti separation of 3 fingers suprapubic level to < 1 finger in order to improve IAP system to sit upright and not sacral sit    Time 8    Period Weeks    Status Achieved      PT LONG  TERM GOAL #5   Title Pt will decrease his Pelvic Pain score on FOTO from 54 pts to < 44 pts in order to improve QOL and restore pelvic floor functions ( 06/02/20: 0 pts)    Time 10    Period Weeks    Status Achieved      PT LONG TERM GOAL #6   Title Pt will increase FOTO urinary problems score from 58 pts to > 63 pts in order to urinate without difficulty ( 06/02/20: 75 pts)    Time 10    Period Weeks    Status Achieved      PT LONG TERM GOAL #7   Title Pt will demo proper alignment with fitness routine, mechanics with running, and soccer techniques and be IND with complimentary strengthening/ stretching in order to minimize relapse of pelvic pain when retunring to sports    Time 10    Period Weeks    Status Partially Met                 Plan - 06/27/20 0857    Clinical Impression Statement Pt has achieved 6/7 goals and is progressing well towards last goal. Pt's urinary urgency and frequency, pelvic pain, post-urine dribble, difficulty with initiating urination have all resolved.   Regional interdependence approach is helpful for pt as pt had originally presented with R pelvic floor tightness.  Pelvic floor overactivity  relapse can be minimized by addressing pt's R  lower kinetic chain deficits.   Pt will have greater outcomes as he gains more R foot mobility and improve his running mechanics to train for a 10 mi race in mid-April. Pt has a Hx of lateral ankle sprains on R and currently is working on increasing ankle and hip strength to minimize risk of future ankle injuries. Pt continues to benefit from skilled PT     Stability/Clinical Decision Making Evolving/Moderate complexity    Rehab Potential Good    PT Frequency 1x / week    PT Duration Other (comment)   10   PT Treatment/Interventions ADLs/Self Care Home Management;Cryotherapy;Moist Heat;Functional mobility training;Stair training;Neuromuscular re-education;Patient/family education;Therapeutic activities;Therapeutic exercise;Manual techniques;Taping;Manual lymph drainage;Traction;Scar mobilization;Balance training;Gait training    Consulted and Agree with Plan of Care Patient           Patient will benefit from skilled therapeutic intervention in order to improve the following deficits and impairments:  Decreased coordination,Decreased endurance,Decreased range of motion,Decreased strength,Decreased scar mobility,Decreased mobility,Improper body mechanics,Pain,Increased muscle spasms,Hypomobility,Impaired flexibility,Difficulty walking,Decreased safety awareness,Postural dysfunction,Abnormal gait  Visit Diagnosis: Sacrococcygeal disorders, not elsewhere classified  Other lack of coordination  Abnormal posture     Problem List Patient Active Problem List   Diagnosis Date Noted  . Internal hemorrhoid 08/26/2019  . History of eosinophilia 12/25/2017    [name] ,PT, DPT, E-RYT  06/27/2020, 9:57 AM  [place] MAIN [facility] SERVICES [address] [place], [place], [address] Phone: [phone]   Fax:  [phone]  Name: Brandon Mcbride MRN: [id number] Date of Birth:  10-Sep-1990

## 2020-07-07 ENCOUNTER — Other Ambulatory Visit: Payer: Self-pay

## 2020-07-07 ENCOUNTER — Ambulatory Visit: Payer: BC Managed Care – PPO | Admitting: Physical Therapy

## 2020-07-07 DIAGNOSIS — R293 Abnormal posture: Secondary | ICD-10-CM

## 2020-07-07 DIAGNOSIS — R278 Other lack of coordination: Secondary | ICD-10-CM | POA: Diagnosis not present

## 2020-07-07 DIAGNOSIS — M533 Sacrococcygeal disorders, not elsewhere classified: Secondary | ICD-10-CM | POA: Diagnosis not present

## 2020-07-07 NOTE — Patient Instructions (Addendum)
Functional positions with baby  Semi tandem stand , 45 deg turn to changing table, car seat , standing at work at the counter To engage legs, pelvis    NO standing with hips to the side when holding baby   Soothing baby in arms: semi tandem stance, rocking back and forth walk side ways + mini squat  Facing strolle or playing on the floor: leg lifts standing or  do on all fours   ___  Getting stand <> floor with ottomen or couch/ chair to lower forearms with baby  Incrementally Minisquat--> crawl down  downward dog pose -->  all fours   ___  Exercises with baby  :Birddog   Table top position,  6 points of contact: paw hands, knees hip width apart, toes tucked under  Shoulders down and back like squeezing armpits  Not sagging back   L arm up , thumbs up, arm is out like a half"V" shoulder blade slides down and back  R knee straight, toes tucked on the ground,  Lengthen whole spine as if yard stick is balanced on spine, chin tucked   L + R = 1 rep 10 reps   X 2    Childs pose,   Downdog , knee bends to stretch hamstrings  Side step soothing   Mini squats for pick up toys  Stairs for HIIT ( not with baby)   Small trampoline and work on jogging with more ballmounds , not lock knees  BOSU ball for ankle strengthening ( aiming ballmounds at center circle)  __  Get baby front carrier   Use this more to avoid long distances carrying carseat   Use stroller in house when need to be hands free and still pay attention to baby   Use baby back pack for hiking and with hiking poles to minimize back pain  __

## 2020-07-08 NOTE — Therapy (Signed)
[place] [facility] SERVICES [address] [place], [place], [address] Phone: [phone]   Fax:  [phone]  Physical Therapy Treatment / Discharge Summary   Patient Details  Name: Brandon Mcbride MRN: [id number] Date of Birth: 01/31/1991 Referring Provider (PT): Vaillancourt   Encounter Date: 07/07/2020   PT End of Session - 07/07/20 1012    Visit Number 10    Number of Visits 10    Date for PT Re-Evaluation 09/05/20    PT Start Time 1006    PT Stop Time 1100    PT Time Calculation (min) 54 min    Activity Tolerance Patient tolerated treatment well    Behavior During Therapy [facility] for tasks assessed/performed           No past medical history on file.  Past Surgical History:  Procedure Laterality Date  . WISDOM TOOTH EXTRACTION      There were no vitals filed for this visit.   Subjective Assessment - 07/07/20 1009    Subjective Pt reported the ballmound of big toe on R is not hurting as much.              [facility] PT Assessment - 07/08/20 1020      Ambulation/Gait   Gait Comments running analysis: no more narrow BOS, midfoot landing                      Pelvic Floor Special Questions - 07/08/20 1019    External Palpation no pelvic floor tightness             [facility] Adult PT Treatment/Exercise - 07/08/20 1020      Therapeutic Activites    Other Therapeutic Activities Discussed body mechanics in preparation for arrival of baby      Neuro Re-ed    Neuro Re-ed Details  cued for body mechanics for floor to stand t/f, baby handling, bending, cued for bird dog for strengthening back, cued for exercises to do with tramploine, BOSU ball for ankle stability  in prep for jogging                       PT Long Term Goals - 07/07/20 1009      PT LONG TERM GOAL #1   Title Pt will report no difficulty with initiating urine and post urine dribble across 1 week in order to work and  participate in community activities (06/02/20: initiating urine is not an issue. Post urine dribble is alot less, 06/27/20: resolved) )    Time 6    Period Weeks    Status Achieved      PT LONG TERM GOAL #2   Title Pt will demo no scrotal / R testicle pain across 2 weeks in order improve QOL and to bend over without pain and return to playing soccer with less risk for relapse    Time 8    Period Weeks    Status Achieved      PT LONG TERM GOAL #3   Title Pt will demo equal pelvic girdle alignment and less spinal deviations across 2 weeks in order to progress to deep core strengthening and pelvic floor training    Time 3    Period Weeks    Status Achieved      PT LONG TERM GOAL #4   Title Pt will demo his diastasis recti separation of 3 fingers suprapubic level to < 1 finger  in order to improve IAP system to sit upright and not sacral sit    Time 8    Period Weeks    Status Achieved      PT LONG TERM GOAL #5   Title Pt will decrease his Pelvic Pain score on FOTO from 54 pts to < 44 pts in order to improve QOL and restore pelvic floor functions ( 06/02/20: 0 pts)    Time 10    Period Weeks    Status Achieved      PT LONG TERM GOAL #6   Title Pt will increase FOTO urinary problems score from 58 pts to > 63 pts in order to urinate without difficulty ( 06/02/20: 75 pts)    Time 10    Period Weeks    Status Achieved      PT LONG TERM GOAL #7   Title Pt will demo proper alignment with fitness routine, mechanics with running, and soccer techniques and be IND with complimentary strengthening/ stretching in order to minimize relapse of pelvic pain when retunring to sports    Time 10    Period Weeks    Status Achieved                 Plan - 07/07/20 1013    Clinical Impression Statement Pt has achieved 100% of his goals with no more  difficulty with initiating urinating and no longer has post urine dribble, no more R testicular pain. Pt 's pelvic misalignment, diastasis recti have  been corrected along with his R lower kinetic chain deficits to minimize relapse of R pelvic floor tightness which was associated with his R testicular pain. Pt also improved with his running mechanics and R ankle mobility/ stability and is return to ready to return to running and playing soccer with less relapse of pelvic floor dysfunction / overactivity of pelvic floor mm.  FOTO score improved significantly.   Discussed body mechanics in preparation for arrival of baby he and husband are fostering. PT demo'd proper techniques. Pt also demo'd IND for other exercises to maintain at home. Pt is ready for d/c at this time.      Stability/Clinical Decision Making Evolving/Moderate complexity    Rehab Potential Good    PT Frequency 1x / week    PT Duration Other (comment)   10   PT Treatment/Interventions ADLs/Self Care Home Management;Cryotherapy;Moist Heat;Functional mobility training;Stair training;Neuromuscular re-education;Patient/family education;Therapeutic activities;Therapeutic exercise;Manual techniques;Taping;Manual lymph drainage;Traction;Scar mobilization;Balance training;Gait training    Consulted and Agree with Plan of Care Patient           Patient will benefit from skilled therapeutic intervention in order to improve the following deficits and impairments:  Decreased coordination,Decreased endurance,Decreased range of motion,Decreased strength,Decreased scar mobility,Decreased mobility,Improper body mechanics,Pain,Increased muscle spasms,Hypomobility,Impaired flexibility,Difficulty walking,Decreased safety awareness,Postural dysfunction,Abnormal gait  Visit Diagnosis: Sacrococcygeal disorders, not elsewhere classified  Other lack of coordination  Abnormal posture     Problem List Patient Active Problem List   Diagnosis Date Noted  . Internal hemorrhoid 08/26/2019  . History of eosinophilia 12/25/2017    Brandon Mcbride ,PT, DPT, E-RYT  07/08/2020, 10:21 AM  [place] [facility] SERVICES [address] [place], [place], [address] Phone: [phone]   Fax:  [phone]  Name: Brandon Mcbride MRN: [id number] Date of Birth: 09-29-1990

## 2020-07-21 ENCOUNTER — Encounter: Payer: BC Managed Care – PPO | Admitting: Physical Therapy

## 2020-07-22 ENCOUNTER — Ambulatory Visit: Payer: BC Managed Care – PPO | Admitting: Physical Therapy

## 2020-07-29 ENCOUNTER — Encounter: Payer: BC Managed Care – PPO | Admitting: Physical Therapy

## 2020-08-26 ENCOUNTER — Other Ambulatory Visit: Payer: Self-pay

## 2020-08-26 ENCOUNTER — Encounter: Payer: Self-pay | Admitting: Emergency Medicine

## 2020-08-26 ENCOUNTER — Emergency Department
Admission: EM | Admit: 2020-08-26 | Discharge: 2020-08-26 | Disposition: A | Discharge status: Home / Self Care | Payer: BC Managed Care – PPO | Attending: Emergency Medicine | Admitting: Emergency Medicine

## 2020-08-26 DIAGNOSIS — S61211A Laceration without foreign body of left index finger without damage to nail, initial encounter: Secondary | ICD-10-CM | POA: Diagnosis not present

## 2020-08-26 DIAGNOSIS — W274XXA Contact with kitchen utensil, initial encounter: Secondary | ICD-10-CM | POA: Diagnosis not present

## 2020-08-26 DIAGNOSIS — S61311A Laceration without foreign body of left index finger with damage to nail, initial encounter: Secondary | ICD-10-CM

## 2020-08-26 DIAGNOSIS — S6992XA Unspecified injury of left wrist, hand and finger(s), initial encounter: Secondary | ICD-10-CM | POA: Diagnosis not present

## 2020-08-26 DIAGNOSIS — Y93G1 Activity, food preparation and clean up: Secondary | ICD-10-CM | POA: Insufficient documentation

## 2020-08-26 MED ORDER — CEPHALEXIN 500 MG PO CAPS: 500.0000 mg | ORAL_CAPSULE | Freq: Four times a day (QID) | ORAL | 0 refills | Status: AC

## 2020-08-26 MED ORDER — CEPHALEXIN 500 MG PO CAPS
1000.0000 mg | ORAL_CAPSULE | Freq: Once | ORAL | Status: AC
  Administered 2020-08-26: 1000 mg via ORAL
  Filled 2020-08-26: qty 2

## 2020-08-26 MED ORDER — LIDOCAINE HCL (PF) 1 % IJ SOLN
5.0000 mL | Freq: Once | INTRAMUSCULAR | Status: AC
  Administered 2020-08-26: 5 mL via INTRADERMAL
  Filled 2020-08-26: qty 5

## 2020-08-26 MED ORDER — BACITRACIN ZINC 500 UNIT/GM EX OINT
TOPICAL_OINTMENT | Freq: Two times a day (BID) | CUTANEOUS | Status: DC
  Filled 2020-08-26: qty 1.8

## 2020-08-26 NOTE — ED Notes (Signed)
See triage note  Presents with laceration to left index finger  States he was cutting an onion  Dressing in place on arrival

## 2020-08-26 NOTE — ED Provider Notes (Signed)
[facility] Emergency Department Provider Note  ____________________________________________   Event Date/Time   First MD Initiated Contact with Patient 08/26/20 1820     (approximate)  I have reviewed the triage vital signs and the nursing notes.   HISTORY  Chief Complaint Laceration   HPI Brandon Mcbride is a 30 y.o. male who presents to the ER for evaluation of laceration to the left index finger. Patient states he was chopping some green onions when he accidentally caught the corner of his finger. He states he had difficulty controlling the bleeding at home. Denies any difficulty bending the finger since that time. States his tetanus is UTD.        History reviewed. No pertinent past medical history.  Patient Active Problem List   Diagnosis Date Noted  . Internal hemorrhoid 08/26/2019  . 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 WISDOM TOOTH EXTRACTION      Prior to Admission medications   Medication Sig Start Date End Date Taking? Authorizing Provider  cephALEXin (KEFLEX) 500 MG capsule Take 1 capsule (500 mg total) by mouth 4 (four) times daily for 10 days. 08/26/20 09/05/20 Yes Marlana Salvage, PA  esomeprazole ([place]) 20 MG packet  03/19/19   [provider]    Allergies Patient has no known allergies.  Family History  Problem Relation Age of Onset  . Healthy Mother   . Hypertension Father   . Hyperlipidemia Father   . Liver cancer Paternal Uncle   . Breast cancer Maternal Grandmother 60  . Esophageal cancer Maternal Grandmother   . Alzheimer's disease Paternal Grandmother   . Parkinson's disease Paternal Grandfather   . ADD / ADHD Brother     Social History Social History   Tobacco Use  . Smoking status: Never Smoker  . Smokeless tobacco: Never Used  Vaping Use  . Vaping Use: Never used  Substance Use Topics  . Alcohol use: Yes     Alcohol/week: 8.0 standard drinks    Types: 8 Standard drinks or equivalent per week  . Drug use: Never    Review of Systems Constitutional: No fever/chills Eyes: No visual changes. ENT: No sore throat. Cardiovascular: Denies chest pain. Respiratory: Denies shortness of breath. Gastrointestinal: No abdominal pain.  No nausea, no vomiting.  No diarrhea.  No constipation. Genitourinary: Negative for dysuria. Musculoskeletal: +left index finger pain, Negative for back pain. Skin: + left index finger laceration Neurological: Negative for headaches, focal weakness or numbness.  ____________________________________________   PHYSICAL EXAM:  VITAL SIGNS: ED Triage Vitals  Enc Vitals Group     BP 08/26/20 1820 121/78     Pulse Rate 08/26/20 1820 66     Resp 08/26/20 1820 18     Temp 08/26/20 1820 98.7 F (37.1 C)     Temp Source 08/26/20 1820 Oral     SpO2 08/26/20 1820 99 %     Weight 08/26/20 1806 163 lb 2.3 oz (74 kg)     Height 08/26/20 1806 6\' 1"  (1.854 m)     Head Circumference --      Peak Flow --      Pain Score 08/26/20 1806 6     Pain Loc --      Pain Edu? --      Excl. in [place]? --    Constitutional: Alert and oriented. Well appearing and in no acute distress. Eyes: Conjunctivae are normal. PERRL. EOMI. Head: Atraumatic.  Nose: No congestion/rhinnorhea. Mouth/Throat: Mucous membranes are moist.  Oropharynx non-erythematous. Neck: No stridor.   Musculoskeletal: 1 cm laceration to the distal tip of the left index finger with the very sliver distal corner of the nail involved.  No laceration or injury to the nailbed.  Minimal active bleeding present.  Patient able to actively flex and extend both the PIP DIP and MCP without difficulty.  Capillary refill less than 3 seconds. Neurologic:  Normal speech and language. No gross focal neurologic deficits are appreciated. No gait instability. Skin:  Skin is warm, dry and intact except as described above. No rash  noted. Psychiatric: Mood and affect are normal. Speech and behavior are normal.   ____________________________________________   INITIAL IMPRESSION / ASSESSMENT AND PLAN / ED COURSE  As part of my medical decision making, I reviewed the following data within the [place] notes reviewed and incorporated and Notes from prior ED visits        Patient is a 30 year old male who presents to the emergency department ration of laceration to distal tip of the left index finger.  See HPI for further details.  On exam, there is a 1 cm diagonal laceration at the very distal tip however the injury cannot slice a sliver out of the finger and the edges do not approximate well for repair.  The very corner of the nail was involved, and this was removed after digital block was performed.  There is minimal active bleeding present, however repair could not be performed.  Discussed wound care and dressing of this in the interim until it heals.  Recommended prophylactic antibiotics with Keflex.  Patient is amenable with plan, stable this time for outpatient follow-up.      ____________________________________________   FINAL CLINICAL IMPRESSION(S) / ED DIAGNOSES  Final diagnoses:  Laceration of left index finger without foreign body with damage to nail, initial encounter     ED Discharge Orders         Ordered    cephALEXin (KEFLEX) 500 MG capsule  4 times daily        08/26/20 2002          *Please note:  Brandon Mcbride was evaluated in Emergency Department on 08/26/2020 for the symptoms described in the history of present illness. He was evaluated in the context of the global COVID-19 pandemic, which necessitated consideration that the patient might be at risk for infection with the SARS-CoV-2 virus that causes COVID-19. Institutional protocols and algorithms that pertain to the evaluation of patients at risk for COVID-19 are in a state of rapid change  based on information released by regulatory bodies including the CDC and federal and state organizations. These policies and algorithms were followed during the patient's care in the ED.  Some ED evaluations and interventions may be delayed as a result of limited staffing during and the pandemic.*   Note:  This document was prepared using Dragon voice recognition software and may include unintentional dictation errors.   Marlana Salvage, PA 08/26/20 2208    Vanessa Arjay, MD 08/27/20 [phone]

## 2020-08-26 NOTE — ED Triage Notes (Signed)
Left index finger laceration from cutting onions.  DSD applied.  Bleeding controlled.  NAD

## 2020-08-26 NOTE — Discharge Instructions (Addendum)
Keep area dry and covered. Take antibiotics as prescribed. Return if any signs of infection including increased redness, drainage or fever. Otherwise, follow up with PCP as needed.

## 2020-12-04 IMAGING — CR DG ABDOMEN 1V
2 series · 2 of 2 positions shown · non-contrast
Comparison: None.

CLINICAL DATA: Flank pain.

EXAM:
ABDOMEN - 1 VIEW

[abdomen kub (1 of 2)]
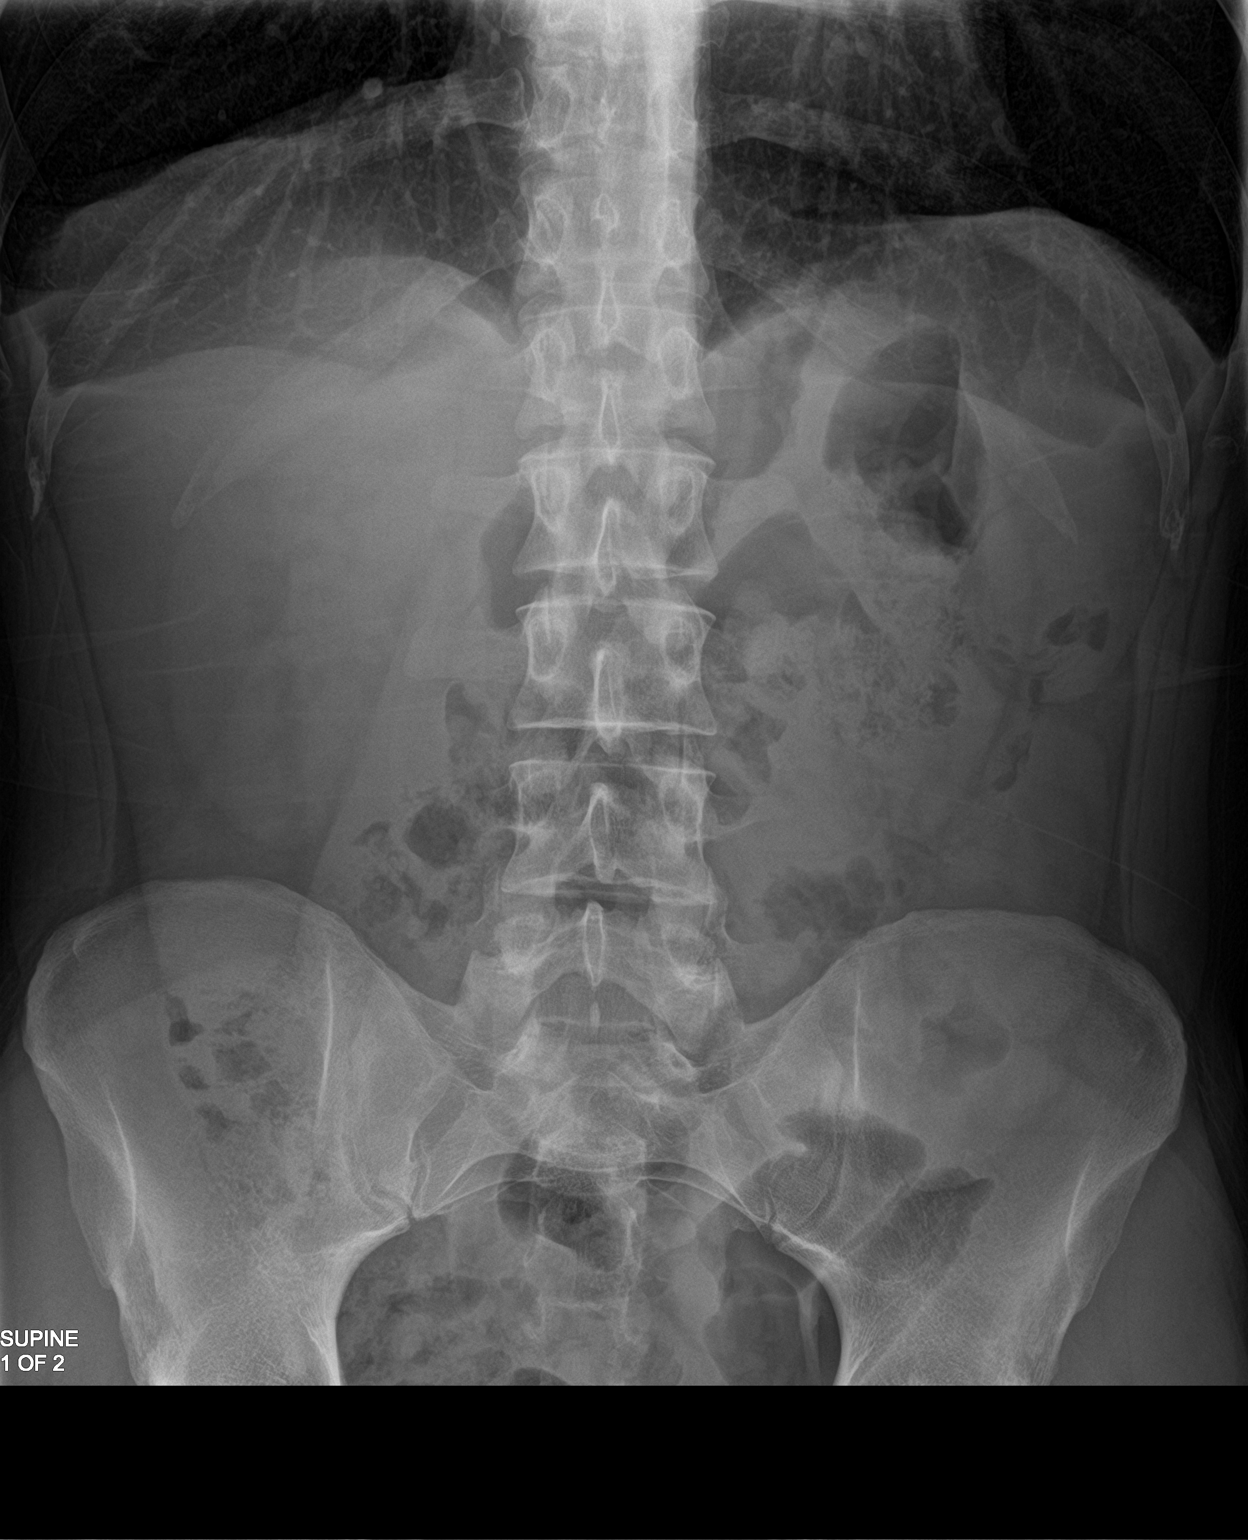

[abdomen kub (2 of 2)]
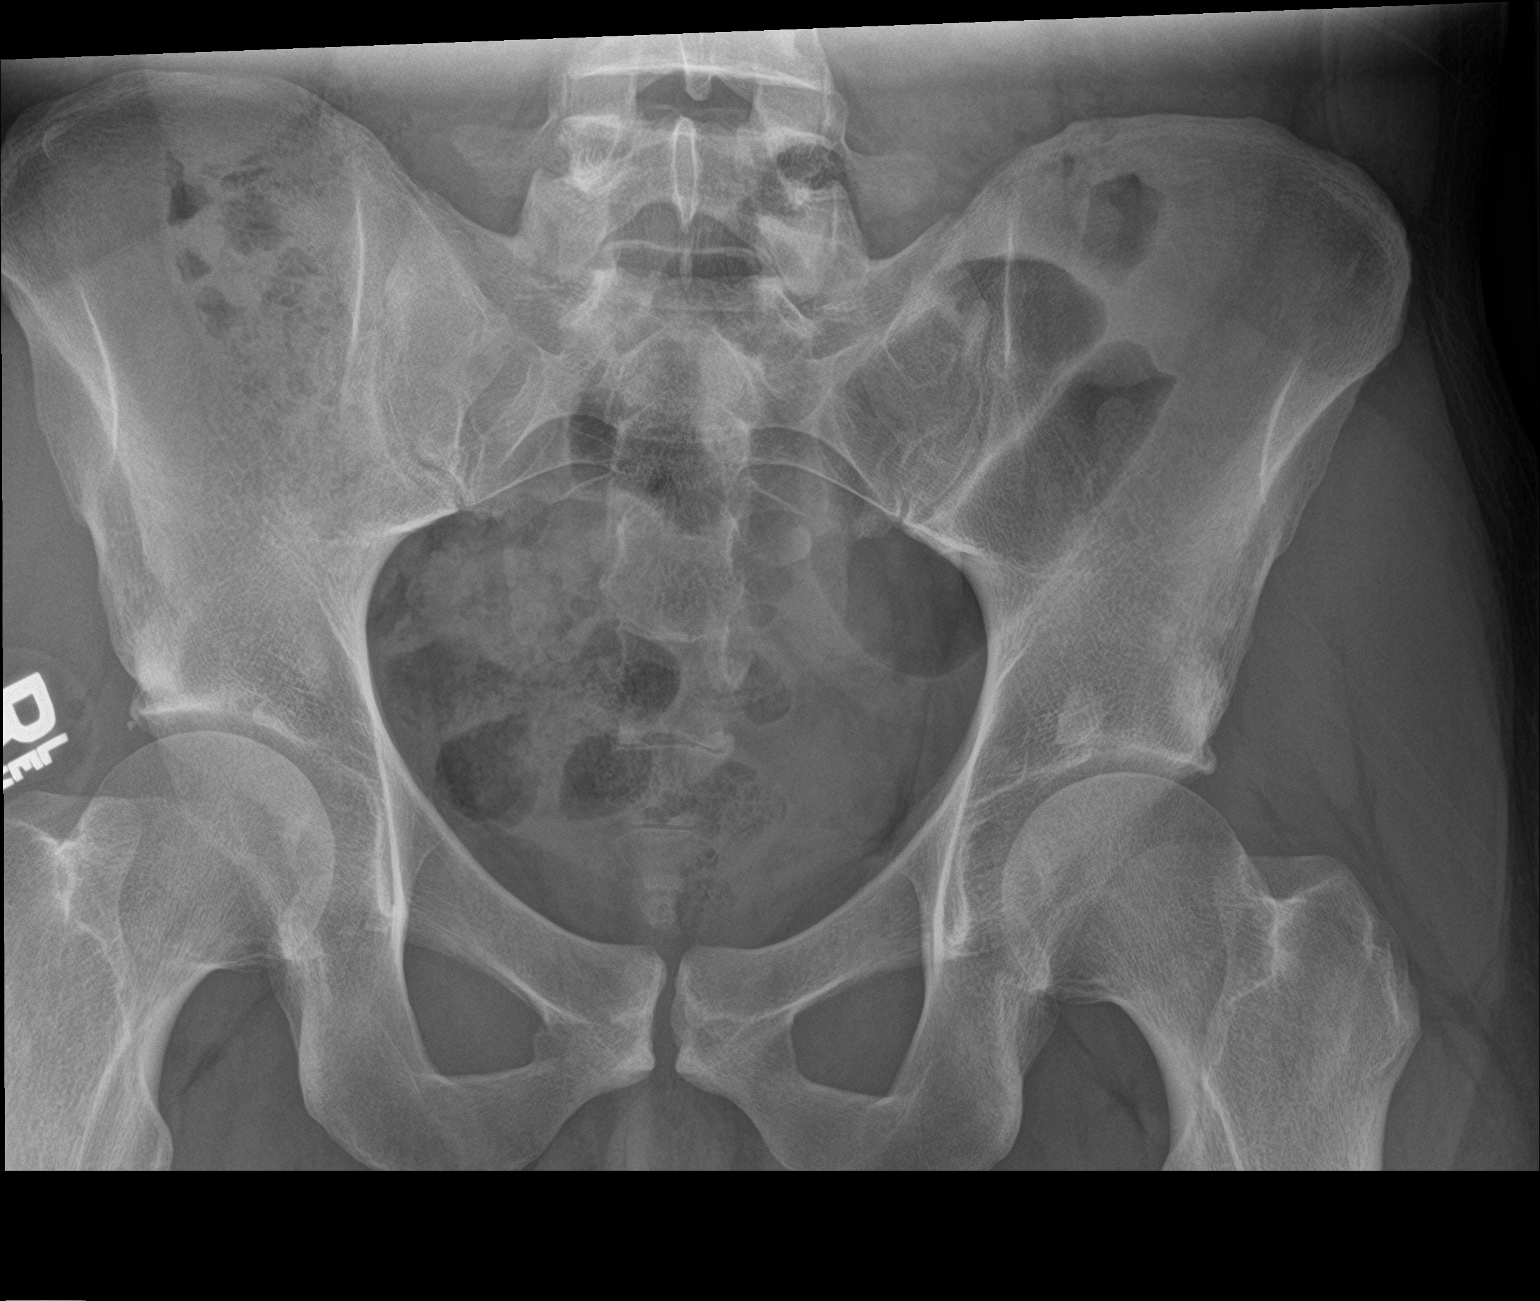

[2 of 2 positions shown; findings below may reference images not displayed]

FINDINGS: The bowel gas pattern is normal. No radio-opaque calculi identified,
although overlapping bowel gas limits evaluation of the left kidney
and bilateral ureters. No other significant radiographic abnormality
are seen.
IMPRESSION: No acute findings.

## 2020-12-10 IMAGING — US US RENAL
2 series · 14 of 25 positions shown · non-contrast
Comparison: None.

CLINICAL DATA: Pain

EXAM:
RENAL / URINARY TRACT ULTRASOUND COMPLETE

[Series 1: us renal · 13 of 29 slices shown]
[im 1/29]
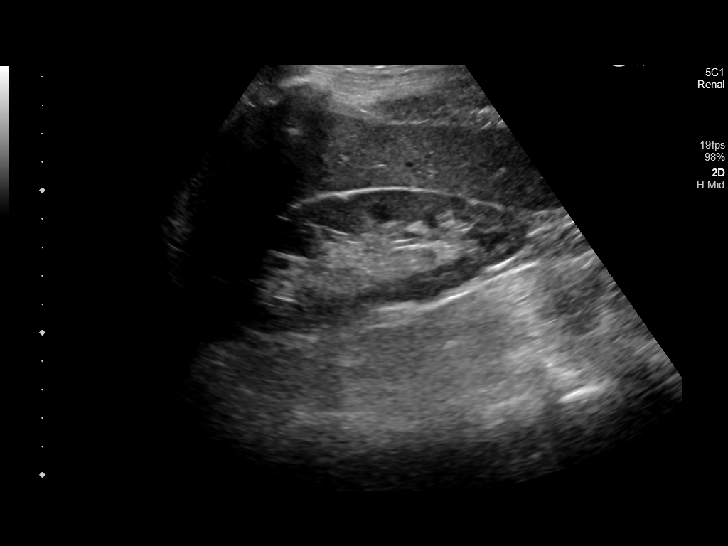
[im 3/29]
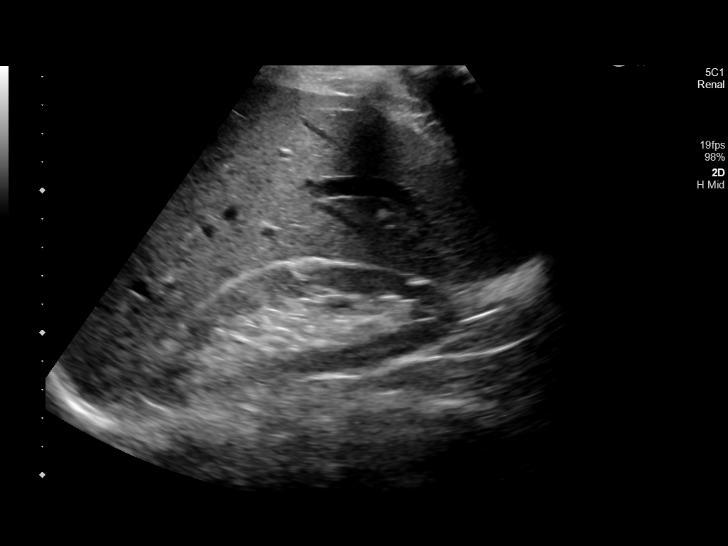
[im 5/29]
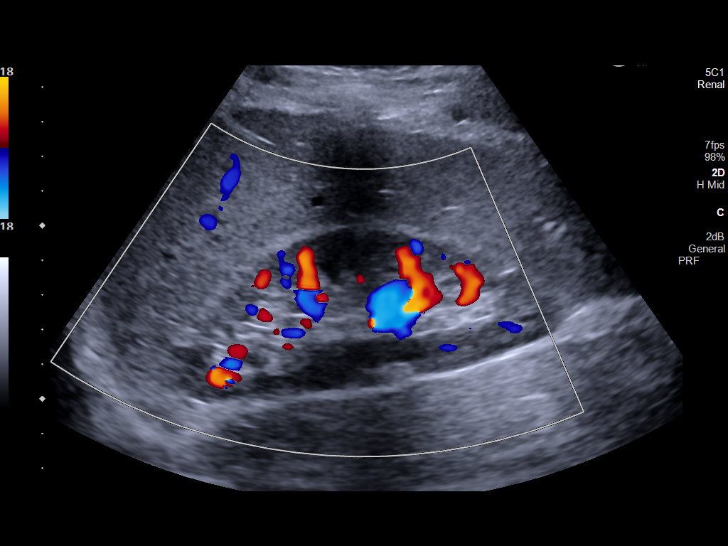
[im 8/29]
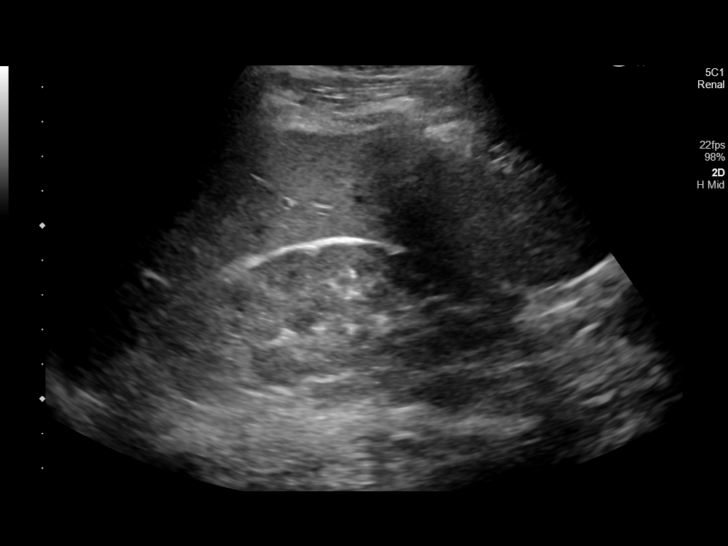
[im 10/29]
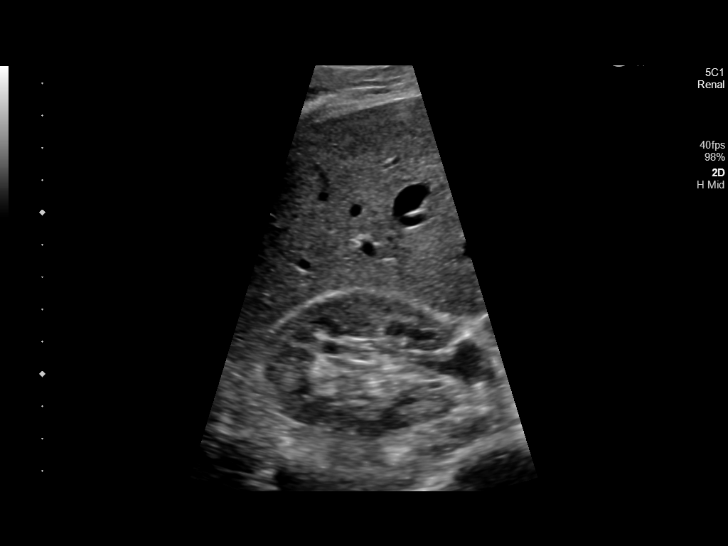
[im 11/29]
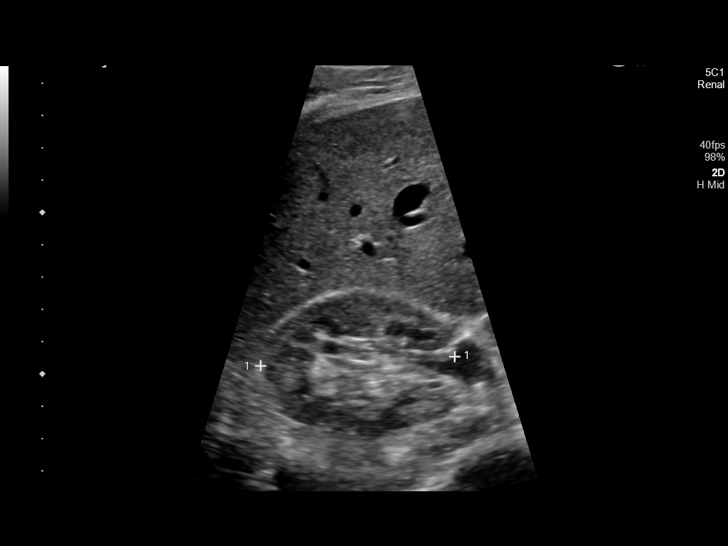
[im 14/29]
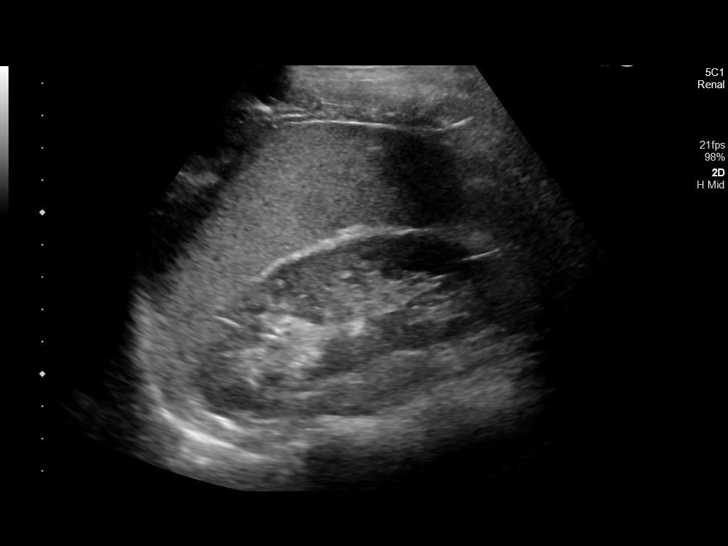
[im 16/29]
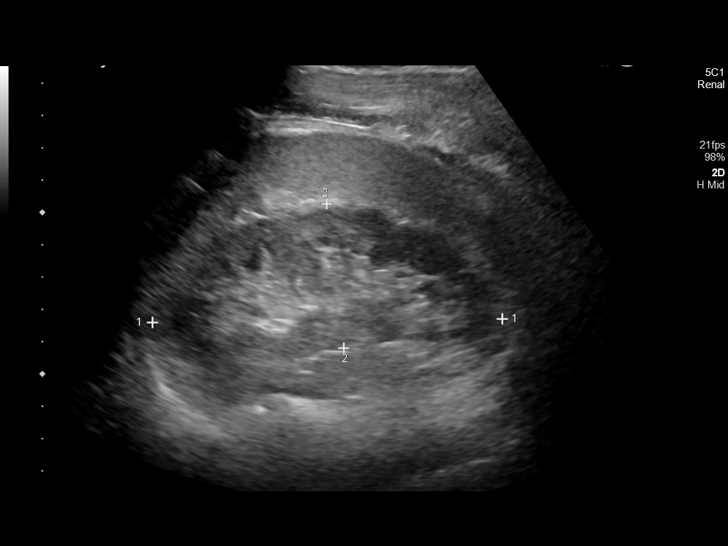
[im 19/29]
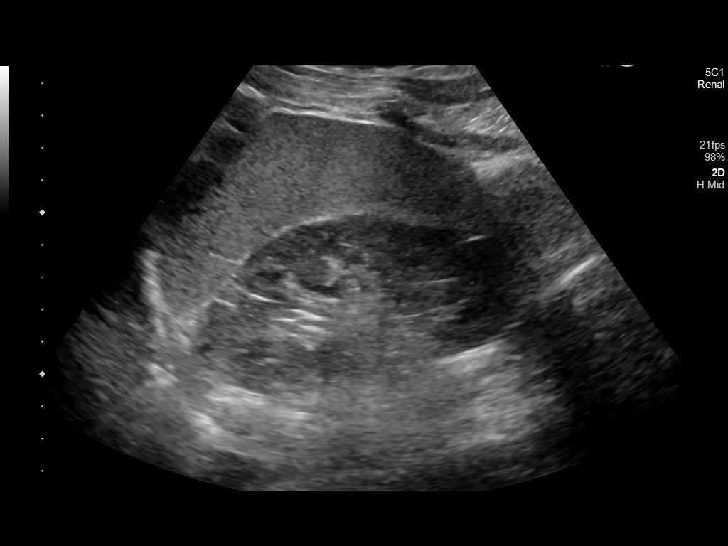
[im 20/29]
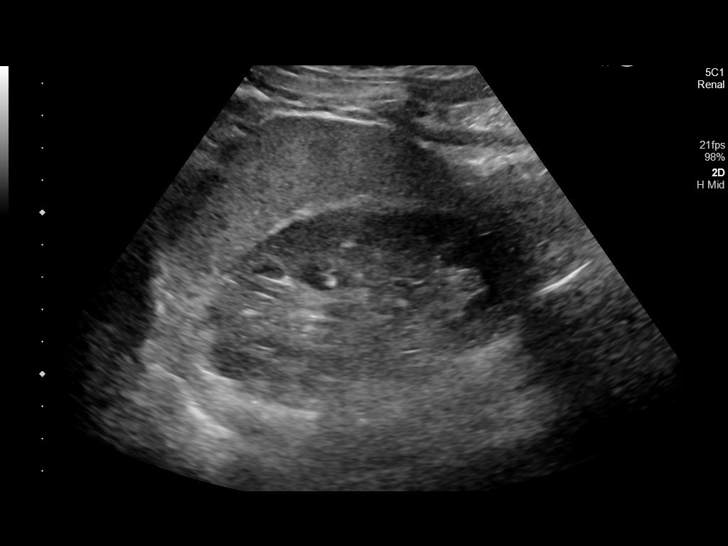
[im 22/29]
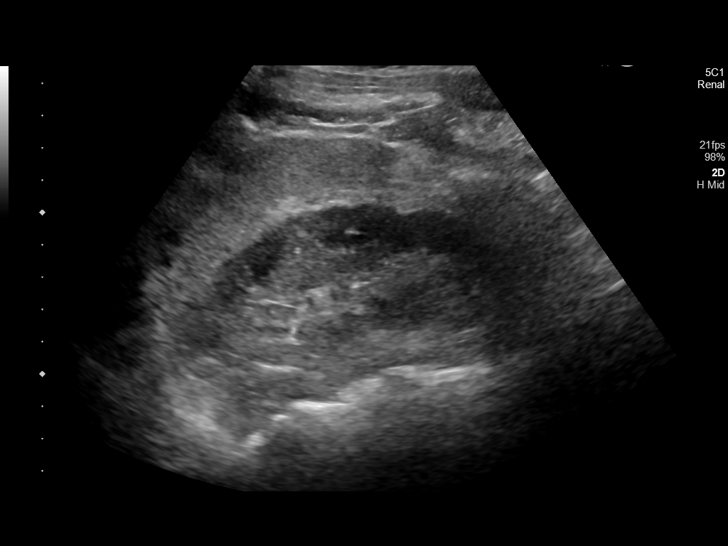
[im 25/29]
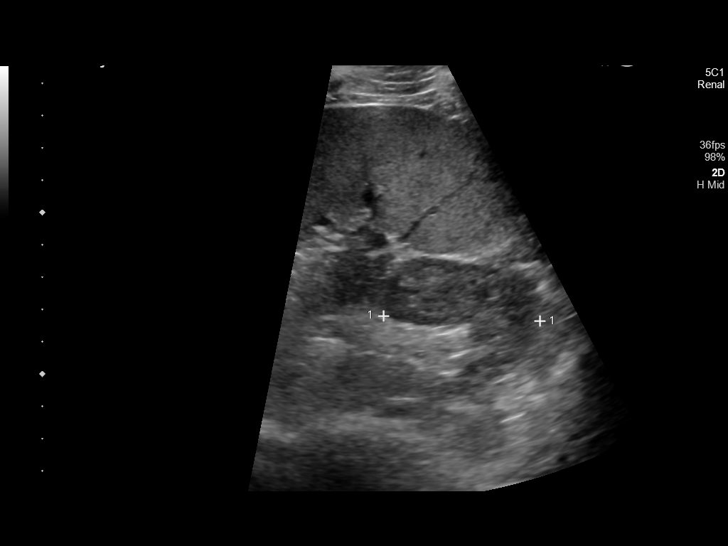
[im 27/29]
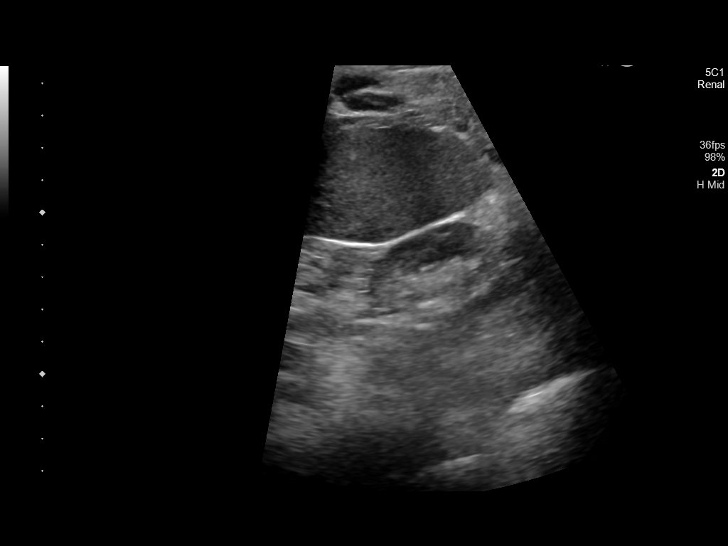

[Series 1001: renal · 1 of 1 slices shown]
[im 1/1]
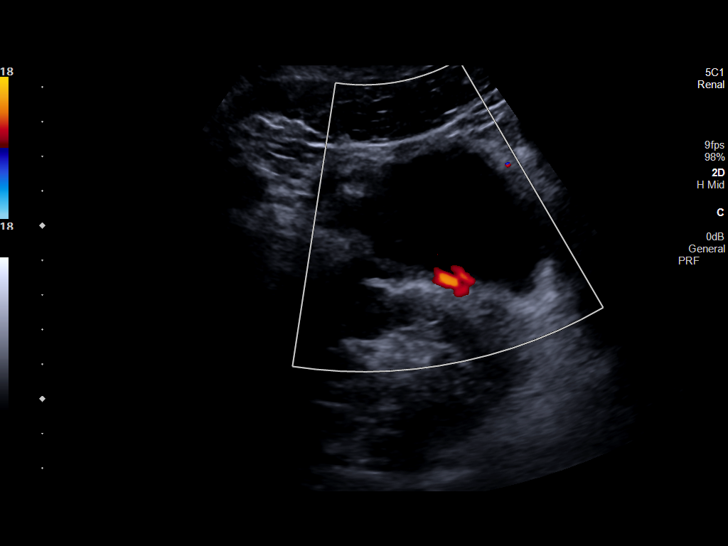

[14 of 25 positions shown; findings below may reference images not displayed]

FINDINGS: Right Kidney:

Renal measurements: 10.7 x 3.8 x 6 cm = volume: 129 mL. Echogenicity
within normal limits. No mass or hydronephrosis visualized.

Left Kidney:

Renal measurements: 10.8 x 4.5 x 4.8 cm = volume: 124 mL.
Echogenicity within normal limits. No mass or hydronephrosis
visualized.

Bladder:

Appears normal for degree of bladder distention.

Other:

None.
IMPRESSION: Normal study.

## 2021-02-22 ENCOUNTER — Other Ambulatory Visit: Payer: Self-pay

## 2021-02-22 ENCOUNTER — Ambulatory Visit: Payer: BC Managed Care – PPO

## 2021-02-22 DIAGNOSIS — Z23 Encounter for immunization: Secondary | ICD-10-CM

## 2021-08-24 ENCOUNTER — Encounter: Payer: Self-pay | Admitting: Nurse Practitioner

## 2021-08-24 ENCOUNTER — Ambulatory Visit: Payer: Self-pay | Admitting: Nurse Practitioner

## 2021-08-24 VITALS — BP 114/82 | HR 66 | Temp 98.0°F | Resp 16

## 2021-08-24 DIAGNOSIS — J029 Acute pharyngitis, unspecified: Secondary | ICD-10-CM

## 2021-08-24 LAB — POCT RAPID STREP A (OFFICE): Rapid Strep A Screen: NEGATIVE

## 2021-08-24 NOTE — Progress Notes (Signed)
? ?  Subjective:  ? ? Patient ID: Brandon Mcbride, male    DOB: [birth year], 31 y.o.   MRN: [id number] ? ?HPI ? ?31 year old male presenting to [facility] with complaints of a sore throat for the past 4 days without fever or other systemic symptoms. He did have a student this week that had strep throat.  ? ?He is not taking anything OTC currently.  ? ?Today's Vitals  ? 08/24/21 1608  ?BP: 114/82  ?Pulse: 66  ?Resp: 16  ?Temp: 98 ?F (36.7 ?C)  ?TempSrc: Tympanic  ?SpO2: 98%  ? ?There is no height or weight on file to calculate BMI.  ? ?Review of Systems  ?Constitutional: Negative.   ?HENT:  Positive for sore throat.   ?Eyes: Negative.   ?Respiratory: Negative.    ?Cardiovascular: Negative.   ?Endocrine: Negative.   ?Genitourinary: Negative.   ?Musculoskeletal: Negative.   ?Neurological: Negative.   ?Hematological: Negative.   ? ?   ?Objective:  ? Physical Exam ?HENT:  ?   Head: Normocephalic.  ?   Right Ear: Tympanic membrane and ear canal normal.  ?   Left Ear: Tympanic membrane and ear canal normal.  ?   Nose: No congestion.  ?   Mouth/Throat:  ?   Mouth: Mucous membranes are moist.  ?   Comments: PND present no erythema  ?Musculoskeletal:  ?   Cervical back: Normal range of motion.  ?Neurological:  ?   Mental Status: He is alert.  ? ? ? ?Recent Results (from the past 2160 hour(s))  ?POCT rapid strep A     Status: Normal  ? Collection Time: 08/24/21  4:10 PM  ?Result Value Ref Range  ? Rapid Strep A Screen Negative Negative  ?  ? ?   ?Assessment & Plan:  ?1. Sore throat ? ?- POCT rapid strep A ?   ?Advised warm salt water gargles and OTC antihistamine (Claritin) daily to help with sore throat related to PND ? ?RTC if symptoms persist or with new concerns as discussed  ?

## 2021-10-23 ENCOUNTER — Encounter: Payer: BC Managed Care – PPO | Admitting: Family Medicine

## 2021-11-14 ENCOUNTER — Ambulatory Visit (INDEPENDENT_AMBULATORY_CARE_PROVIDER_SITE_OTHER): Payer: BC Managed Care – PPO | Admitting: Family Medicine

## 2021-11-14 ENCOUNTER — Encounter: Payer: Self-pay | Admitting: Family Medicine

## 2021-11-14 VITALS — BP 116/76 | HR 60 | Temp 98.4°F | Resp 16 | Ht 73.0 in | Wt 166.8 lb

## 2021-11-14 DIAGNOSIS — Z0289 Encounter for other administrative examinations: Secondary | ICD-10-CM

## 2021-11-14 NOTE — Progress Notes (Signed)
   SUBJECTIVE:   CHIEF COMPLAINT / HPI:   Form completion - need form for adoption services completed - adopting for the second time. Has current adopted child with husband, doing well, healthy. - no chronic medical problems, only on nexium prn for reflux.  - no h/o substance use or communicable disease.   OBJECTIVE:   BP 116/76 (BP Location: Left Arm, Patient Position: Sitting, Cuff Size: Normal)   Pulse 60   Temp 98.4 F (36.9 C) (Oral)   Resp 16   Ht '6\' 1"'$  (1.854 m)   Wt 166 lb 12.8 oz (75.7 kg)   BMI 22.01 kg/m   Gen: well appearing, in NAD Card: Reg rate Lungs: Comfortable WOB on RA Ext: WWP, no edema   ASSESSMENT/PLAN:   Form completion Healthy male with no barriers to adoption, form completed.    Myles Gip, DO

## 2022-01-14 DIAGNOSIS — S63681A Other sprain of right thumb, initial encounter: Secondary | ICD-10-CM | POA: Diagnosis not present

## 2022-01-26 NOTE — Progress Notes (Signed)
I,Brandon Mcbride,acting as a Education administrator for Brandon Paganini, MD.,have documented all relevant documentation on the behalf of Brandon Paganini, MD,as directed by  Brandon Paganini, MD while in the presence of Brandon Paganini, MD.    Complete physical exam   Patient: Brandon Mcbride   DOB: August 20, 1990   31 y.o. Male  MRN: [id number] Visit Date: 01/29/2022  Today's healthcare provider: [name], MD   Chief Complaint  Patient presents with   Annual Exam   Subjective    Brandon Mcbride is a 31 y.o. male who presents today for a complete physical exam.  He reports consuming a general diet. Home exercise routine includes soccer weekly. He generally feels well. He reports sleeping well. He does not have additional problems to discuss today.  HPI   Had one episode a month ago with clenching in chest, flushing, numbness in back of both arms. Lasted for maybe a minute. During sexual activity. Has not happened since.  Never happens with exercise (runs and plays soccer).  Suspect benign - discussed.  Pulled R quads last week. Trying to stretch after exercise.  Felt a pulling sensation and now some burning.  Feels worse than most pulled muscles he has had. No popping. Able to ambulate.  Suspect quad strain.  History reviewed. No pertinent past medical history. Past Surgical History:  Procedure Laterality Date   WISDOM TOOTH EXTRACTION     Social History   Socioeconomic History   Marital status: Married    Spouse name: Not on file   Number of children: 0   Years of education: Not on file   Highest education level: Not on file  Occupational History   Occupation: PhD Student in Biology  Tobacco Use   Smoking status: Never   Smokeless tobacco: Never  Vaping Use   Vaping Use: Never used  Substance and Sexual Activity   Alcohol use: Yes    Alcohol/week: 8.0 standard drinks of alcohol    Types: 8 Standard drinks or equivalent per week    Drug use: Never   Sexual activity: Yes    Partners: Male    Comment: with husband  Other Topics Concern   Not on file  Social History Narrative   Not on file   Social Determinants of Health   Financial Resource Strain: Not on file  Food Insecurity: Not on file  Transportation Needs: Not on file  Physical Activity: Not on file  Stress: Not on file  Social Connections: Not on file  Intimate Partner Violence: Not on file   Family Status  Relation Name Status   Mother  Alive   Father  Alive   Brandon Mcbride  (Not Specified)   MGM  Alive   Brandon Mcbride  Deceased   PGF  Deceased   Brother  Alive   MGF  Alive   Family History  Problem Relation Age of Onset   Healthy Mother    Hypertension Father    Hyperlipidemia Father    Liver cancer Paternal Uncle    Breast cancer Maternal Grandmother 60   Esophageal cancer Maternal Grandmother    Alzheimer's disease Paternal Grandmother    Parkinson's disease Paternal Grandfather    ADD / ADHD Brother    No Known Allergies  Patient Care Team: Giomar Gusler, Dionne Bucy, MD as PCP - General (Family Medicine) Brita Romp, Dionne Bucy, MD (Family Medicine)   Medications: Outpatient Medications Prior to Visit  Medication Sig   esomeprazole ([place]) 20 MG packet  No facility-administered medications prior to visit.    Review of Systems  HENT:  Positive for postnasal drip.   All other systems reviewed and are negative.   Last CBC Lab Results  Component Value Date   WBC 4.8 12/31/2017   HGB 14.9 12/31/2017   HCT 44.0 12/31/2017   MCV 85 12/31/2017   MCH 28.9 12/31/2017   RDW 13.1 12/31/2017   PLT 201 41/32/4401   Last metabolic panel Lab Results  Component Value Date   GLUCOSE 85 12/31/2017   NA 139 12/31/2017   K 4.0 12/31/2017   CL 103 12/31/2017   CO2 23 12/31/2017   BUN 12 12/31/2017   CREATININE 1.06 12/31/2017   GFRNONAA 96 12/31/2017   CALCIUM 9.1 12/31/2017   PROT 6.6 12/31/2017   ALBUMIN 4.7 12/31/2017   LABGLOB 1.9  12/31/2017   AGRATIO 2.5 (H) 12/31/2017   BILITOT 1.0 12/31/2017   ALKPHOS 69 12/31/2017   AST 14 12/31/2017   ALT 18 12/31/2017   ANIONGAP 9 12/11/2014   Last lipids Lab Results  Component Value Date   CHOL 136 12/31/2017   HDL 44 12/31/2017   LDLCALC 77 12/31/2017   TRIG 74 12/31/2017   CHOLHDL 3.1 12/31/2017   Last thyroid functions Lab Results  Component Value Date   TSH 1.920 12/31/2017    Objective    BP 114/72 (BP Location: Left Arm, Patient Position: Sitting, Cuff Size: Large)   Pulse (!) 59   Temp 98.8 F (37.1 C) (Oral)   Resp 16   Ht 6' 1"  (1.854 m)   Wt 168 lb 8 oz (76.4 kg)   BMI 22.23 kg/m  BP Readings from Last 3 Encounters:  01/29/22 114/72  11/14/21 116/76  08/24/21 114/82   Wt Readings from Last 3 Encounters:  01/29/22 168 lb 8 oz (76.4 kg)  11/14/21 166 lb 12.8 oz (75.7 kg)  08/26/20 163 lb 2.3 oz (74 kg)      Physical Exam Vitals reviewed.  Constitutional:      General: He is not in acute distress.    Appearance: Normal appearance. He is well-developed. He is not diaphoretic.  HENT:     Head: Normocephalic and atraumatic.     Right Ear: Tympanic membrane, ear canal and external ear normal.     Left Ear: Tympanic membrane, ear canal and external ear normal.     Nose: Nose normal.     Mouth/Throat:     Mouth: Mucous membranes are moist.     Pharynx: Oropharynx is clear. No oropharyngeal exudate.  Eyes:     General: No scleral icterus.    Conjunctiva/sclera: Conjunctivae normal.     Pupils: Pupils are equal, round, and reactive to light.  Neck:     Thyroid: No thyromegaly.  Cardiovascular:     Rate and Rhythm: Normal rate and regular rhythm.     Pulses: Normal pulses.     Heart sounds: Normal heart sounds. No murmur heard. Pulmonary:     Effort: Pulmonary effort is normal. No respiratory distress.     Breath sounds: Normal breath sounds. No wheezing or rales.  Abdominal:     General: There is no distension.     Palpations:  Abdomen is soft.     Tenderness: There is no abdominal tenderness.  Musculoskeletal:        General: No deformity.     Cervical back: Neck supple.     Right lower leg: No edema.     Left lower  leg: No edema.  Lymphadenopathy:     Cervical: No cervical adenopathy.  Skin:    General: Skin is warm and dry.     Findings: No rash.  Neurological:     Mental Status: He is alert and oriented to person, place, and time. Mental status is at baseline.     Sensory: No sensory deficit.     Motor: No weakness.     Gait: Gait normal.  Psychiatric:        Mood and Affect: Mood normal.        Behavior: Behavior normal.        Thought Content: Thought content normal.       Last depression screening scores    11/14/2021    9:48 AM 02/23/2019   11:14 AM 12/25/2017    2:22 PM  PHQ 2/9 Scores  PHQ - 2 Score 0 1 0  PHQ- 9 Score  3 0   Last fall risk screening    02/23/2019   11:14 AM  [place] in the past year? 0  Number falls in past yr: 0  Injury with Fall? 0   Last Audit-C alcohol use screening    02/23/2019   11:14 AM  Alcohol Use Disorder Test (AUDIT)  1. How often do you have a drink containing alcohol? 4  2. How many drinks containing alcohol do you have on a typical day when you are drinking? 0  3. How often do you have six or more drinks on one occasion? 0  AUDIT-C Score 4  4. How often during the last year have you found that you were not able to stop drinking once you had started? 0  5. How often during the last year have you failed to do what was normally expected from you because of drinking? 0  6. How often during the last year have you needed a first drink in the morning to get yourself going after a heavy drinking session? 0  7. How often during the last year have you had a feeling of guilt of remorse after drinking? 0  8. How often during the last year have you been unable to remember what happened the night before because you had been drinking? 0  9. Have you  or someone else been injured as a result of your drinking? 0  10. Has a relative or friend or a doctor or another health worker been concerned about your drinking or suggested you cut down? 0  Alcohol Use Disorder Identification Test Final Score (AUDIT) 4   A score of 3 or more in women, and 4 or more in men indicates increased risk for alcohol abuse, EXCEPT if all of the points are from question 1   No results found for any visits on 01/29/22.  Assessment & Plan    Routine Health Maintenance and Physical Exam  Exercise Activities and Dietary recommendations  Goals   None     Immunization History  Administered Date(s) Administered   DTaP 12/17/1991, 04/08/1992, 11/09/1992, 10/15/1993, 09/07/1996   HIB (PRP-OMP) 10/16/1991, 12/17/1991, 04/08/1992, 08/25/1992   Hepatitis A 12/10/2016, 06/11/2017   Hepatitis B 10/16/1991, 04/08/1992, 08/03/1992   Hpv-Unspecified 12/10/2016, 06/11/2017, 10/28/2017   IPV 10/16/1991, 12/17/1991, 08/03/1992, 09/07/1996   Influenza,inj,Quad PF,6+ Mos 12/25/2017, 02/23/2019, 02/22/2021   MMR 08/25/1992, 09/07/1996   Td 08/23/2009    Health Maintenance  Topic Date Due   Hepatitis C Screening  Never done   TETANUS/TDAP  08/24/2019   INFLUENZA  VACCINE  11/21/2021   HPV VACCINES  Completed   HIV Screening  Completed    Discussed health benefits of physical activity, and encouraged him to engage in regular exercise appropriate for his age and condition.  Problem List Items Addressed This Visit   None Visit Diagnoses     Encounter for annual health examination    -  Primary   Relevant Orders   Comprehensive metabolic panel   Lipid Panel With LDL/HDL Ratio   Need for Tdap vaccination       Need for hepatitis C screening test       Relevant Orders   Hepatitis C antibody       Will try to get records from last TDAP or see if he is UTD   Return in about 1 year (around 01/30/2023) for CPE.     I, Brandon Paganini, MD, have reviewed all  documentation for this visit. The documentation on 01/29/22 for the exam, diagnosis, procedures, and orders are all accurate and complete.   Elantra Caprara, Dionne Bucy, MD, MPH [facility]

## 2022-01-29 ENCOUNTER — Encounter: Payer: Self-pay | Admitting: Family Medicine

## 2022-01-29 ENCOUNTER — Ambulatory Visit (INDEPENDENT_AMBULATORY_CARE_PROVIDER_SITE_OTHER): Payer: BC Managed Care – PPO | Admitting: Family Medicine

## 2022-01-29 VITALS — BP 114/72 | HR 59 | Temp 98.8°F | Resp 16 | Ht 73.0 in | Wt 168.5 lb

## 2022-01-29 DIAGNOSIS — Z1159 Encounter for screening for other viral diseases: Secondary | ICD-10-CM | POA: Diagnosis not present

## 2022-01-29 DIAGNOSIS — Z0000 Encounter for general adult medical examination without abnormal findings: Secondary | ICD-10-CM

## 2022-01-29 DIAGNOSIS — Z23 Encounter for immunization: Secondary | ICD-10-CM

## 2022-01-30 LAB — COMPREHENSIVE METABOLIC PANEL
ALT: 14 IU/L (ref 0–44)
AST: 16 IU/L (ref 0–40)
Albumin/Globulin Ratio: 2.6 — ABNORMAL HIGH (ref 1.2–2.2)
Albumin: 5.2 g/dL (ref 4.3–5.2)
Alkaline Phosphatase: 62 IU/L (ref 44–121)
BUN/Creatinine Ratio: 12 (ref 9–20)
BUN: 13 mg/dL (ref 6–20)
Bilirubin Total: 1 mg/dL (ref 0.0–1.2)
CO2: 21 mmol/L (ref 20–29)
Calcium: 9.7 mg/dL (ref 8.7–10.2)
Chloride: 103 mmol/L (ref 96–106)
Creatinine, Ser: 1.09 mg/dL (ref 0.76–1.27)
Globulin, Total: 2 g/dL (ref 1.5–4.5)
Glucose: 92 mg/dL (ref 70–99)
Potassium: 4.3 mmol/L (ref 3.5–5.2)
Sodium: 142 mmol/L (ref 134–144)
Total Protein: 7.2 g/dL (ref 6.0–8.5)
eGFR: 94 mL/min/{1.73_m2} (ref >59)

## 2022-01-30 LAB — LIPID PANEL WITH LDL/HDL RATIO
Cholesterol, Total: 139 mg/dL (ref 100–199)
HDL: 59 mg/dL (ref >39)
LDL Chol Calc (NIH): 68 mg/dL (ref 0–99)
LDL/HDL Ratio: 1.2 ratio (ref 0.0–3.6)
Triglycerides: 53 mg/dL (ref 0–149)
VLDL Cholesterol Cal: 12 mg/dL (ref 5–40)

## 2022-01-30 LAB — HEPATITIS C ANTIBODY: Hep C Virus Ab: NONREACTIVE

## 2022-01-31 ENCOUNTER — Encounter: Payer: Self-pay | Admitting: Family Medicine

## 2022-02-02 ENCOUNTER — Encounter: Payer: Self-pay | Admitting: Family Medicine

## 2022-02-02 ENCOUNTER — Ambulatory Visit (INDEPENDENT_AMBULATORY_CARE_PROVIDER_SITE_OTHER): Payer: BC Managed Care – PPO | Admitting: Family Medicine

## 2022-02-02 DIAGNOSIS — Z23 Encounter for immunization: Secondary | ICD-10-CM | POA: Diagnosis not present

## 2022-02-02 NOTE — Progress Notes (Signed)
Patient here for TDAP vaccination only.  I did not examine the patient.  I did review his medical history, medications, and allergies and vaccine consent form.  CMA gave vaccination. Patient tolerated well.  Virginia Crews, MD, MPH [facility] 02/02/2022 8:49 AM

## 2022-05-01 DIAGNOSIS — F411 Generalized anxiety disorder: Secondary | ICD-10-CM | POA: Diagnosis not present

## 2022-05-09 DIAGNOSIS — F411 Generalized anxiety disorder: Secondary | ICD-10-CM | POA: Diagnosis not present

## 2022-05-16 DIAGNOSIS — L814 Other melanin hyperpigmentation: Secondary | ICD-10-CM | POA: Diagnosis not present

## 2022-05-16 DIAGNOSIS — D485 Neoplasm of uncertain behavior of skin: Secondary | ICD-10-CM | POA: Diagnosis not present

## 2022-05-16 DIAGNOSIS — D2262 Melanocytic nevi of left upper limb, including shoulder: Secondary | ICD-10-CM | POA: Diagnosis not present

## 2022-05-16 DIAGNOSIS — D2261 Melanocytic nevi of right upper limb, including shoulder: Secondary | ICD-10-CM | POA: Diagnosis not present

## 2022-05-16 DIAGNOSIS — D225 Melanocytic nevi of trunk: Secondary | ICD-10-CM | POA: Diagnosis not present

## 2022-05-21 DIAGNOSIS — F411 Generalized anxiety disorder: Secondary | ICD-10-CM | POA: Diagnosis not present

## 2022-06-01 DIAGNOSIS — F411 Generalized anxiety disorder: Secondary | ICD-10-CM | POA: Diagnosis not present

## 2022-06-13 DIAGNOSIS — F411 Generalized anxiety disorder: Secondary | ICD-10-CM | POA: Diagnosis not present

## 2022-06-29 DIAGNOSIS — F411 Generalized anxiety disorder: Secondary | ICD-10-CM | POA: Diagnosis not present

## 2022-07-12 DIAGNOSIS — F411 Generalized anxiety disorder: Secondary | ICD-10-CM | POA: Diagnosis not present

## 2022-08-09 NOTE — Progress Notes (Signed)
I,[name],acting as a scribe for Shirlee Latch, MD.,have documented all relevant documentation on the behalf of Shirlee Latch, MD,as directed by  Shirlee Latch, MD while in the presence of Shirlee Latch, MD.     Established patient visit   Patient: Brandon Mcbride   DOB: 03-03-1991   32 y.o. Male  MRN: [id number] Visit Date: 08/10/2022  Today's healthcare provider: Shirlee Latch, MD   Chief Complaint  Patient presents with   Shoulder Pain   Subjective    Shoulder Pain  The pain is present in the right shoulder. This is a recurrent problem. The current episode started more than 1 month ago. There has been no history of extremity trauma. The problem has been waxing and waning. The quality of the pain is described as burning. The pain is moderate. Exacerbated by: stress. He has tried NSAIDS for the symptoms. The treatment provided moderate relief.   Reports that trapezius muscle is often sore from stress and poor posture. Now feels like a blooming and burning pain on R side between shoulder blade and spine  Massage hasn't helped.  Position changes will help. Will subside in 10-15 min  Medications: Outpatient Medications Prior to Visit  Medication Sig   esomeprazole (NEXIUM) 20 MG packet    No facility-administered medications prior to visit.    Review of Systems per HPI     Objective    BP 120/80 (BP Location: Left Arm, Patient Position: Sitting, Cuff Size: Large)   Pulse 60   Temp 98 F (36.7 C) (Temporal)   Resp 12   Wt 173 lb 14.4 oz (78.9 kg)   BMI 22.94 kg/m    Physical Exam Vitals reviewed.  Constitutional:      General: He is not in acute distress.    Appearance: Normal appearance. He is not diaphoretic.  HENT:     Head: Normocephalic and atraumatic.     Mouth/Throat:     Comments: Small vesicles on upper lip, clustered with erythematous base Eyes:     General: No scleral icterus.    Conjunctiva/sclera:  Conjunctivae normal.  Cardiovascular:     Rate and Rhythm: Normal rate and regular rhythm.     Pulses: Normal pulses.     Heart sounds: Normal heart sounds. No murmur heard. Pulmonary:     Effort: Pulmonary effort is normal. No respiratory distress.     Breath sounds: Normal breath sounds. No wheezing or rhonchi.  Musculoskeletal:     Cervical back: Neck supple.     Right lower leg: No edema.     Left lower leg: No edema.     Comments: Tightness of b/l trapezius muscles R>L  Lymphadenopathy:     Cervical: No cervical adenopathy.  Skin:    General: Skin is warm and dry.     Findings: No rash.  Neurological:     Mental Status: He is alert and oriented to person, place, and time. Mental status is at baseline.  Psychiatric:        Mood and Affect: Mood normal.        Behavior: Behavior normal.       No results found for any visits on 08/10/22.  Assessment & Plan     1. Trapezius muscle spasm - pain seems to come from tightness of the traps - encouraged stretchign regularly - heat pad and massage may be helpful - ok to use flexeril prn  2. Cold sore - new problem - treat with valtrex  Meds ordered this encounter  Medications   cyclobenzaprine (FLEXERIL) 5 MG tablet    Sig: Take 1 tablet (5 mg total) by mouth 3 (three) times daily as needed for muscle spasms.    Dispense:  30 tablet    Refill:  1   valACYclovir (VALTREX) 1000 MG tablet    Sig: Take 2 tablets (2,000 mg total) by mouth 2 (two) times daily for 1 day.    Dispense:  4 tablet    Refill:  0     Return if symptoms worsen or fail to improve.      I, Shirlee Latch, MD, have reviewed all documentation for this visit. The documentation on 08/10/22 for the exam, diagnosis, procedures, and orders are all accurate and complete.   Zaiah Credeur, Marzella Schlein, MD, MPH [facility]

## 2022-08-10 ENCOUNTER — Encounter: Payer: Self-pay | Admitting: Family Medicine

## 2022-08-10 ENCOUNTER — Ambulatory Visit (INDEPENDENT_AMBULATORY_CARE_PROVIDER_SITE_OTHER): Payer: BC Managed Care – PPO | Admitting: Family Medicine

## 2022-08-10 VITALS — BP 120/80 | HR 60 | Temp 98.0°F | Resp 12 | Wt 173.9 lb

## 2022-08-10 DIAGNOSIS — B001 Herpesviral vesicular dermatitis: Secondary | ICD-10-CM | POA: Diagnosis not present

## 2022-08-10 DIAGNOSIS — M62838 Other muscle spasm: Secondary | ICD-10-CM

## 2022-08-10 MED ORDER — VALACYCLOVIR HCL 1 G PO TABS: 2000.0000 mg | ORAL_TABLET | Freq: Two times a day (BID) | ORAL | 0 refills | Status: AC

## 2022-08-10 MED ORDER — CYCLOBENZAPRINE HCL 5 MG PO TABS: 5.0000 mg | ORAL_TABLET | Freq: Three times a day (TID) | ORAL | 1 refills | Status: DC | PRN

## 2022-08-16 DIAGNOSIS — R42 Dizziness and giddiness: Secondary | ICD-10-CM | POA: Diagnosis not present

## 2022-09-24 NOTE — Progress Notes (Unsigned)
I,Santana Gosdin S Louie Meaders,acting as a Neurosurgeon for Shirlee Latch, MD.,have documented all relevant documentation on the behalf of Shirlee Latch, MD,as directed by  Shirlee Latch, MD while in the presence of Shirlee Latch, MD.    Complete physical exam   Patient: Brandon Mcbride   DOB: [birth year]   32 y.o. Male  MRN: [id number] Visit Date: 09/25/2022  Today's healthcare provider: Shirlee Latch, MD   No chief complaint on file.  Subjective    Brandon Mcbride is a 32 y.o. male who presents today for a complete physical exam.  He reports consuming a {diet types:17450} diet. {Exercise:19826} He generally feels {well/fairly well/poorly:18703}. He reports sleeping {well/fairly well/poorly:18703}. He {does/does not:200015} have additional problems to discuss today.  HPI    No past medical history on file. Past Surgical History:  Procedure Laterality Date   WISDOM TOOTH EXTRACTION     Social History   Socioeconomic History   Marital status: Married    Spouse name: Not on file   Number of children: 0   Years of education: Not on file   Highest education level: Doctorate  Occupational History   Occupation: PhD Consulting civil engineer in Biology  Tobacco Use   Smoking status: Never   Smokeless tobacco: Never  Vaping Use   Vaping Use: Never used  Substance and Sexual Activity   Alcohol use: Yes    Alcohol/week: 8.0 standard drinks of alcohol    Types: 8 Standard drinks or equivalent per week   Drug use: Never   Sexual activity: Yes    Partners: Male    Comment: with husband  Other Topics Concern   Not on file  Social History Narrative   Not on file   Social Determinants of Health   Financial Resource Strain: Low Risk  (08/06/2022)   Overall Financial Resource Strain (CARDIA)    Difficulty of Paying Living Expenses: Not hard at all  Food Insecurity: No Food Insecurity (08/06/2022)   Hunger Vital Sign    Worried About Running Out of Food  in the Last Year: Never true    Ran Out of Food in the Last Year: Never true  Transportation Needs: No Transportation Needs (08/06/2022)   PRAPARE - Administrator, Civil Service (Medical): No    Lack of Transportation (Non-Medical): No  Physical Activity: Insufficiently Active (08/06/2022)   Exercise Vital Sign    Days of Exercise per Week: 2 days    Minutes of Exercise per Session: 30 min  Stress: Stress Concern Present (08/06/2022)   [employer] of Occupational Health - Occupational Stress Questionnaire    Feeling of Stress : To some extent  Social Connections: Moderately Isolated (08/06/2022)   Social Connection and Isolation Panel [NHANES]    Frequency of Communication with Friends and Family: Once a week    Frequency of Social Gatherings with Friends and Family: Once a week    Attends Religious Services: More than 4 times per year    Active Member of [employer] or Organizations: No    Attends Engineer, structural: Not on file    Marital Status: Married  Catering manager Violence: Not on file   Family Status  Relation Name Status   Mother  Alive   Father  Alive   Nutritional therapist  (Not Specified)   MGM  Alive   PGM  Deceased   PGF  Deceased   Brother  Alive   MGF  Alive   Family History  Problem Relation  Age of Onset   Healthy Mother    Hypertension Father    Hyperlipidemia Father    Liver cancer Paternal Uncle    Breast cancer Maternal Grandmother 26   Esophageal cancer Maternal Grandmother    Alzheimer's disease Paternal Grandmother    Parkinson's disease Paternal Grandfather    ADD / ADHD Brother    No Known Allergies  Patient Care Team: Bacigalupo, Marzella Schlein, MD as PCP - General (Family Medicine) Beryle Flock, Marzella Schlein, MD (Family Medicine)   Medications: Outpatient Medications Prior to Visit  Medication Sig   cyclobenzaprine (FLEXERIL) 5 MG tablet Take 1 tablet (5 mg total) by mouth 3 (three) times daily as needed for muscle spasms.    esomeprazole (NEXIUM) 20 MG packet    No facility-administered medications prior to visit.    Review of Systems  Last CBC Lab Results  Component Value Date   WBC 4.8 12/31/2017   HGB 14.9 12/31/2017   HCT 44.0 12/31/2017   MCV 85 12/31/2017   MCH 28.9 12/31/2017   RDW 13.1 12/31/2017   PLT 201 12/31/2017   Last metabolic panel Lab Results  Component Value Date   GLUCOSE 92 01/29/2022   NA 142 01/29/2022   K 4.3 01/29/2022   CL 103 01/29/2022   CO2 21 01/29/2022   BUN 13 01/29/2022   CREATININE 1.09 01/29/2022   EGFR 94 01/29/2022   CALCIUM 9.7 01/29/2022   PROT 7.2 01/29/2022   ALBUMIN 5.2 01/29/2022   LABGLOB 2.0 01/29/2022   AGRATIO 2.6 (H) 01/29/2022   BILITOT 1.0 01/29/2022   ALKPHOS 62 01/29/2022   AST 16 01/29/2022   ALT 14 01/29/2022   ANIONGAP 9 12/11/2014   Last lipids Lab Results  Component Value Date   CHOL 139 01/29/2022   HDL 59 01/29/2022   LDLCALC 68 01/29/2022   TRIG 53 01/29/2022   CHOLHDL 3.1 12/31/2017   Last thyroid functions Lab Results  Component Value Date   TSH 1.920 12/31/2017      Objective    There were no vitals taken for this visit. BP Readings from Last 3 Encounters:  08/10/22 120/80  01/29/22 114/72  11/14/21 116/76   Wt Readings from Last 3 Encounters:  08/10/22 173 lb 14.4 oz (78.9 kg)  01/29/22 168 lb 8 oz (76.4 kg)  11/14/21 166 lb 12.8 oz (75.7 kg)       Physical Exam  ***  Last depression screening scores    08/10/2022    8:11 AM 01/29/2022    8:44 AM 11/14/2021    9:48 AM  PHQ 2/9 Scores  PHQ - 2 Score 0 0 0  PHQ- 9 Score 1 0    Last fall risk screening    08/10/2022    8:11 AM  Fall Risk   Falls in the past year? 0  Number falls in past yr: 0  Injury with Fall? 0  Risk for fall due to : No Fall Risks  Follow up Falls evaluation completed   Last Audit-C alcohol use screening    08/10/2022    8:11 AM  Alcohol Use Disorder Test (AUDIT)  1. How often do you have a drink containing  alcohol? 4  2. How many drinks containing alcohol do you have on a typical day when you are drinking? 0  3. How often do you have six or more drinks on one occasion? 0  AUDIT-C Score 4   A score of 3 or more in women, and 4 or more  in men indicates increased risk for alcohol abuse, EXCEPT if all of the points are from question 1   No results found for any visits on 09/25/22.  Assessment & Plan    Routine Health Maintenance and Physical Exam  Exercise Activities and Dietary recommendations  Goals   None     Immunization History  Administered Date(s) Administered   COVID-19, mRNA, vaccine(Comirnaty)12 years and older 06/29/2019, 07/27/2019   DTaP 12/17/1991, 04/08/1992, 11/09/1992, 10/15/1993, 09/07/1996   HIB (PRP-OMP) 10/16/1991, 12/17/1991, 04/08/1992, 08/25/1992   Hepatitis A 12/10/2016, 06/11/2017   Hepatitis B 10/16/1991, 04/08/1992, 08/03/1992   Hpv-Unspecified 12/10/2016, 06/11/2017, 10/28/2017   IPV 10/16/1991, 12/17/1991, 08/03/1992, 09/07/1996   Influenza,inj,Quad PF,6+ Mos 12/25/2017, 02/23/2019, 02/22/2021, 01/29/2022   MMR 08/25/1992, 09/07/1996   Moderna Sars-Covid-2 Vaccination 03/14/2020   Td 08/23/2009   Tdap 02/02/2022    Health Maintenance  Topic Date Due   COVID-19 Vaccine (4 - 2023-24 season) 12/22/2021   INFLUENZA VACCINE  11/22/2022   DTaP/Tdap/Td (8 - Td or Tdap) 02/03/2032   HPV VACCINES  Completed   Hepatitis C Screening  Completed   HIV Screening  Completed    Discussed health benefits of physical activity, and encouraged him to engage in regular exercise appropriate for his age and condition.  ***  No follow-ups on file.     {provider attestation***:1}   Shirlee Latch, MD  [facility] [phone] (phone) [phone] (fax)  [facility]

## 2022-09-25 ENCOUNTER — Ambulatory Visit (INDEPENDENT_AMBULATORY_CARE_PROVIDER_SITE_OTHER): Payer: BC Managed Care – PPO | Admitting: Family Medicine

## 2022-09-25 ENCOUNTER — Encounter: Payer: Self-pay | Admitting: Family Medicine

## 2022-09-25 VITALS — BP 111/74 | HR 61 | Temp 97.6°F | Ht 71.0 in | Wt 174.0 lb

## 2022-09-25 DIAGNOSIS — Z0000 Encounter for general adult medical examination without abnormal findings: Secondary | ICD-10-CM

## 2022-11-08 DIAGNOSIS — M545 Low back pain, unspecified: Secondary | ICD-10-CM | POA: Diagnosis not present

## 2022-11-08 DIAGNOSIS — K5900 Constipation, unspecified: Secondary | ICD-10-CM | POA: Diagnosis not present

## 2022-11-13 DIAGNOSIS — R1032 Left lower quadrant pain: Secondary | ICD-10-CM | POA: Diagnosis not present

## 2022-11-13 DIAGNOSIS — K8020 Calculus of gallbladder without cholecystitis without obstruction: Secondary | ICD-10-CM | POA: Diagnosis not present

## 2022-12-03 DIAGNOSIS — K8020 Calculus of gallbladder without cholecystitis without obstruction: Secondary | ICD-10-CM | POA: Diagnosis not present

## 2023-03-18 DIAGNOSIS — F4321 Adjustment disorder with depressed mood: Secondary | ICD-10-CM | POA: Diagnosis not present

## 2023-03-25 DIAGNOSIS — F4321 Adjustment disorder with depressed mood: Secondary | ICD-10-CM | POA: Diagnosis not present

## 2023-04-01 DIAGNOSIS — F4321 Adjustment disorder with depressed mood: Secondary | ICD-10-CM | POA: Diagnosis not present

## 2023-04-08 DIAGNOSIS — F4321 Adjustment disorder with depressed mood: Secondary | ICD-10-CM | POA: Diagnosis not present

## 2023-04-16 DIAGNOSIS — F4321 Adjustment disorder with depressed mood: Secondary | ICD-10-CM | POA: Diagnosis not present

## 2023-04-22 DIAGNOSIS — F4321 Adjustment disorder with depressed mood: Secondary | ICD-10-CM | POA: Diagnosis not present

## 2023-09-26 ENCOUNTER — Encounter: Payer: Self-pay | Admitting: Family Medicine
